# Patient Record
Sex: Female | Born: 1955 | Race: White | Hispanic: No | Marital: Married | Smoking: Former smoker
Health system: Southern US, Community
[De-identification: ages and names within clinical notes are randomized; demographics above are authoritative.]

## PROBLEM LIST (undated history)

## (undated) DIAGNOSIS — L719 Rosacea, unspecified: Secondary | ICD-10-CM

## (undated) HISTORY — PX: APPENDECTOMY: SHX54

## (undated) HISTORY — PX: KNEE ARTHROSCOPY: SUR90

## (undated) HISTORY — PX: TONSILLECTOMY: SUR1361

---

## 1999-06-28 ENCOUNTER — Other Ambulatory Visit: Admission: RE | Admit: 1999-06-28 | Discharge: 1999-06-28 | Payer: Self-pay | Admitting: Obstetrics & Gynecology

## 2000-12-02 ENCOUNTER — Other Ambulatory Visit: Admission: RE | Admit: 2000-12-02 | Discharge: 2000-12-02 | Payer: Self-pay | Admitting: Obstetrics & Gynecology

## 2003-12-12 ENCOUNTER — Other Ambulatory Visit: Admission: RE | Admit: 2003-12-12 | Discharge: 2003-12-12 | Payer: Self-pay | Admitting: Obstetrics & Gynecology

## 2004-05-04 ENCOUNTER — Ambulatory Visit (HOSPITAL_COMMUNITY): Admission: RE | Admit: 2004-05-04 | Discharge: 2004-05-04 | Payer: Self-pay | Admitting: Obstetrics & Gynecology

## 2011-04-23 ENCOUNTER — Other Ambulatory Visit: Payer: Self-pay | Admitting: Dermatology

## 2012-09-15 ENCOUNTER — Other Ambulatory Visit: Payer: Self-pay | Admitting: Obstetrics & Gynecology

## 2013-06-07 ENCOUNTER — Other Ambulatory Visit: Payer: Self-pay | Admitting: Dermatology

## 2013-11-15 ENCOUNTER — Other Ambulatory Visit: Payer: Self-pay | Admitting: Obstetrics & Gynecology

## 2013-11-16 LAB — CYTOLOGY - PAP

## 2014-05-02 ENCOUNTER — Other Ambulatory Visit (INDEPENDENT_AMBULATORY_CARE_PROVIDER_SITE_OTHER): Payer: Self-pay | Admitting: Surgery

## 2014-06-03 ENCOUNTER — Other Ambulatory Visit: Payer: Self-pay | Admitting: Surgery

## 2014-07-07 ENCOUNTER — Encounter (HOSPITAL_BASED_OUTPATIENT_CLINIC_OR_DEPARTMENT_OTHER): Payer: Self-pay | Admitting: *Deleted

## 2014-07-10 NOTE — H&P (Signed)
Annette Neal  [facility] Patient #: [id number] DOB: [birth year] Married / Language: English / Race: White Female  History of Present Illness (Annette Neal A. Magnus IvanBlackman MD;  Patient words: eval left breast.  The patient is a 59 year old female who presents with a complaint of Breast problems. This is a very pleasant female referred by Dr. Aldona BarWein for evaluation of a left breast cyst. This first presented back in 2014 as what appeared to be an infected sebaceous cyst. She reported a small area became erythematous. She went to an urgent care and had an incision and drainage. Most recently, during a physical examination a possible recurrent cyst could be palpated. She had negative mammograms in September 2015. She does have a family history which is positive for breast cancer in her mother at age 59. She denies nipple discharge. She denies breast pain   Other Problems Lamar Laundry(Sonya Bynum, CMA; No pertinent past medical history  Past Surgical History Gilmer Mor(Sonya Bynum, CMA; Appendectomy Colon Polyp Removal - Colonoscopy Oral Surgery Tonsillectomy  Diagnostic Studies History (Sonya Bynum, CMA Colonoscopy 1-5 years ago Mammogram within last year Pap Smear 1-5 years ago  Allergies (Sonya Bynum, C No Known Drug Allergies0  Medication History (Sonya Bynum, CMA; No Current Medications  Social History Lamar Laundry(Sonya Bynum, CMA;  Alcohol use Moderate alcohol use. Caffeine use Coffee. No drug use Tobacco use Former smoker.  Family History Gilmer Mor(Sonya Bynum, CMA;  Arthritis Mother. Breast Cancer Mother. Cancer Sister. Colon Polyps Father. Diabetes Mellitus Father. Heart Disease Father. Hypertension Father. Melanoma Father. Seizure disorder Mother.  Pregnancy / Birth History Gilmer Mor(Sonya Bynum, CMA;  Age at menarche 12 years. Age of menopause 51-55 Contraceptive History Oral contraceptives. Gravida 3 Maternal age 59-25 Para 3  Review of Systems  ( General Not Present- Appetite Loss, Chills, Fatigue, Fever, Night Sweats, Weight Gain and Weight Loss. Skin Not Present- Change in Wart/Mole, Dryness, Hives, Jaundice, New Lesions, Non-Healing Wounds, Rash and Ulcer. HEENT Not Present- Earache, Hearing Loss, Hoarseness, Nose Bleed, Oral Ulcers, Ringing in the Ears, Seasonal Allergies, Sinus Pain, Sore Throat, Visual Disturbances, Wears glasses/contact lenses and Yellow Eyes. Respiratory Not Present- Bloody sputum, Chronic Cough, Difficulty Breathing, Snoring and Wheezing. Breast Present- Breast Mass. Not Present- Breast Pain, Nipple Discharge and Skin Changes. Cardiovascular Not Present- Chest Pain, Difficulty Breathing Lying Down, Leg Cramps, Palpitations, Rapid Heart Rate, Shortness of Breath and Swelling of Extremities. Gastrointestinal Not Present- Abdominal Pain, Bloating, Bloody Stool, Change in Bowel Habits, Chronic diarrhea, Constipation, Difficulty Swallowing, Excessive gas, Gets full quickly at meals, Hemorrhoids, Indigestion, Nausea, Rectal Pain and Vomiting. Female Genitourinary Not Present- Frequency, Nocturia, Painful Urination, Pelvic Pain and Urgency. Musculoskeletal Not Present- Back Pain, Joint Pain, Joint Stiffness, Muscle Pain, Muscle Weakness and Swelling of Extremities. Neurological Not Present- Decreased Memory, Fainting, Headaches, Numbness, Seizures, Tingling, Tremor, Trouble walking and Weakness. Psychiatric Not Present- Anxiety, Bipolar, Change in Sleep Pattern, Depression, Fearful and Frequent crying. Endocrine Not Present- Cold Intolerance, Excessive Hunger, Hair Changes, Heat Intolerance, Hot flashes and New Diabetes. Hematology Not Present- Easy Bruising, Excessive bleeding, Gland problems, HIV and Persistent Infections.   Vitals (  Weight: 169 lb Height: 66in Body Surface Area: 1.89 m Body Mass Index: 27.28 kg/m Temp.: 97.48F(Temporal)  Pulse: 77 (Regular)  BP: 132/80 (Sitting, Left Arm,  Standard)    Physical Exam The physical exam findings are as follows: Note:On exam, there are some skin changes from the previous I&D at the left breast at the 7 o'clock position. On deep palpation, I can feel  approximately a 3-4 mm round mass. This feels deeper than is typical sebaceous cyst and may represent scar tissue. There is no erythema and it is nontender.  Lungs are clear bilaterally  Cardiovascular is regular rate and rhythm    Assessment & Plan  SEBACEOUS CYST OF BREAST, LEFT (610.8  N60.82) Impression: This may just represent scar tissue as I cannot feel a specific cyst. I discussed surgical excision of this area to prevent recurrence first close follow-up as this may just represent scar tissue. She is going to go home and discuss this with her husband. If she elects conservative management, I will see her back in 3 months for reexamination.  Addendum:  She has decided to proceed with surgery.  The risks were discussed in detail and she wishes to proceed with excision of the chronic left breast cyst.

## 2014-07-11 ENCOUNTER — Ambulatory Visit (HOSPITAL_BASED_OUTPATIENT_CLINIC_OR_DEPARTMENT_OTHER): Payer: BC Managed Care – PPO | Admitting: Anesthesiology

## 2014-07-11 ENCOUNTER — Ambulatory Visit (HOSPITAL_BASED_OUTPATIENT_CLINIC_OR_DEPARTMENT_OTHER)
Admission: RE | Admit: 2014-07-11 | Discharge: 2014-07-11 | Disposition: A | Discharge status: Home / Self Care | Payer: BC Managed Care – PPO | Source: Ambulatory Visit | Attending: Surgery | Admitting: Surgery

## 2014-07-11 ENCOUNTER — Encounter (HOSPITAL_BASED_OUTPATIENT_CLINIC_OR_DEPARTMENT_OTHER)
Admission: RE | Disposition: A | Discharge status: Home / Self Care | Payer: Self-pay | Source: Ambulatory Visit | Attending: Surgery

## 2014-07-11 ENCOUNTER — Encounter (HOSPITAL_BASED_OUTPATIENT_CLINIC_OR_DEPARTMENT_OTHER): Payer: Self-pay

## 2014-07-11 DIAGNOSIS — Z803 Family history of malignant neoplasm of breast: Secondary | ICD-10-CM | POA: Diagnosis not present

## 2014-07-11 DIAGNOSIS — N6002 Solitary cyst of left breast: Secondary | ICD-10-CM | POA: Diagnosis present

## 2014-07-11 DIAGNOSIS — Z87891 Personal history of nicotine dependence: Secondary | ICD-10-CM | POA: Insufficient documentation

## 2014-07-11 DIAGNOSIS — N6012 Diffuse cystic mastopathy of left breast: Secondary | ICD-10-CM | POA: Insufficient documentation

## 2014-07-11 HISTORY — DX: Rosacea, unspecified: L71.9

## 2014-07-11 HISTORY — PX: BREAST CYST EXCISION: SHX579

## 2014-07-11 LAB — POCT HEMOGLOBIN-HEMACUE: Hemoglobin: 13.6 g/dL (ref 12.0–15.0)

## 2014-07-11 SURGERY — EXCISION, CYST, BREAST
Anesthesia: General | Site: Breast | Laterality: Left

## 2014-07-11 MED ORDER — BUPIVACAINE-EPINEPHRINE (PF) 0.5% -1:200000 IJ SOLN
INTRAMUSCULAR | Status: AC
  Filled 2014-07-11: qty 30

## 2014-07-11 MED ORDER — ACETAMINOPHEN 325 MG PO TABS: 650.0000 mg | ORAL_TABLET | ORAL | Status: DC | PRN

## 2014-07-11 MED ORDER — HYDROCODONE-ACETAMINOPHEN 5-325 MG PO TABS: 1.0000 | ORAL_TABLET | ORAL | Status: DC | PRN

## 2014-07-11 MED ORDER — BUPIVACAINE-EPINEPHRINE 0.5% -1:200000 IJ SOLN
INTRAMUSCULAR | Status: DC | PRN
  Administered 2014-07-11: 18 mL

## 2014-07-11 MED ORDER — ONDANSETRON HCL 4 MG/2ML IJ SOLN: INTRAMUSCULAR | Status: DC | PRN

## 2014-07-11 MED ORDER — ACETAMINOPHEN 500 MG PO TABS: 1000.0000 mg | ORAL_TABLET | Freq: Four times a day (QID) | ORAL | Status: DC

## 2014-07-11 MED ORDER — PROMETHAZINE HCL 25 MG/ML IJ SOLN: 6.2500 mg | INTRAMUSCULAR | Status: DC | PRN

## 2014-07-11 MED ORDER — ACETAMINOPHEN 650 MG RE SUPP: 650.0000 mg | RECTAL | Status: DC | PRN

## 2014-07-11 MED ORDER — LACTATED RINGERS IV SOLN
INTRAVENOUS | Status: DC
  Administered 2014-07-11: 07:00:00 via INTRAVENOUS

## 2014-07-11 MED ORDER — ONDANSETRON HCL 4 MG/2ML IJ SOLN
INTRAMUSCULAR | Status: DC | PRN
  Administered 2014-07-11: 4 mg via INTRAVENOUS

## 2014-07-11 MED ORDER — SODIUM CHLORIDE 0.9 % IV SOLN: 250.0000 mL | INTRAVENOUS | Status: DC | PRN

## 2014-07-11 MED ORDER — OXYCODONE HCL 5 MG PO TABS: 5.0000 mg | ORAL_TABLET | ORAL | Status: DC | PRN

## 2014-07-11 MED ORDER — FENTANYL CITRATE (PF) 100 MCG/2ML IJ SOLN
INTRAMUSCULAR | Status: DC | PRN
  Administered 2014-07-11: 100 ug via INTRAVENOUS

## 2014-07-11 MED ORDER — GLYCOPYRROLATE 0.2 MG/ML IJ SOLN: 0.2000 mg | Freq: Once | INTRAMUSCULAR | Status: DC | PRN

## 2014-07-11 MED ORDER — MIDAZOLAM HCL 2 MG/2ML IJ SOLN
INTRAMUSCULAR | Status: AC
  Filled 2014-07-11: qty 2

## 2014-07-11 MED ORDER — DEXAMETHASONE SODIUM PHOSPHATE 4 MG/ML IJ SOLN
INTRAMUSCULAR | Status: DC | PRN
  Administered 2014-07-11: 10 mg via INTRAVENOUS

## 2014-07-11 MED ORDER — HYDROMORPHONE HCL 1 MG/ML IJ SOLN: 0.2500 mg | INTRAMUSCULAR | Status: DC | PRN

## 2014-07-11 MED ORDER — MIDAZOLAM HCL 2 MG/2ML IJ SOLN
1.0000 mg | INTRAMUSCULAR | Status: DC | PRN
Start: 2014-07-11 — End: 2014-07-11
  Administered 2014-07-11: 2 mg via INTRAVENOUS

## 2014-07-11 MED ORDER — CEFAZOLIN SODIUM-DEXTROSE 2-3 GM-% IV SOLR
INTRAVENOUS | Status: AC
  Filled 2014-07-11: qty 50

## 2014-07-11 MED ORDER — FENTANYL CITRATE (PF) 100 MCG/2ML IJ SOLN: 50.0000 ug | INTRAMUSCULAR | Status: DC | PRN

## 2014-07-11 MED ORDER — FENTANYL CITRATE (PF) 100 MCG/2ML IJ SOLN
INTRAMUSCULAR | Status: AC
  Filled 2014-07-11: qty 6

## 2014-07-11 MED ORDER — SODIUM CHLORIDE 0.9 % IJ SOLN: 3.0000 mL | INTRAMUSCULAR | Status: DC | PRN

## 2014-07-11 MED ORDER — CEFAZOLIN SODIUM-DEXTROSE 2-3 GM-% IV SOLR
2.0000 g | INTRAVENOUS | Status: AC
  Administered 2014-07-11: 2 g via INTRAVENOUS

## 2014-07-11 MED ORDER — PROPOFOL 10 MG/ML IV BOLUS
INTRAVENOUS | Status: DC | PRN
  Administered 2014-07-11: 200 mg via INTRAVENOUS

## 2014-07-11 MED ORDER — MORPHINE SULFATE 2 MG/ML IJ SOLN: 1.0000 mg | INTRAMUSCULAR | Status: DC | PRN

## 2014-07-11 MED ORDER — SODIUM CHLORIDE 0.9 % IJ SOLN: 3.0000 mL | Freq: Two times a day (BID) | INTRAMUSCULAR | Status: DC

## 2014-07-11 SURGICAL SUPPLY — 46 items
BLADE HEX COATED 2.75 (ELECTRODE) ×3 IMPLANT
BLADE SURG 15 STRL LF DISP TIS (BLADE) ×1 IMPLANT
BLADE SURG 15 STRL SS (BLADE) ×3
CANISTER SUCT 1200ML W/VALVE (MISCELLANEOUS) IMPLANT
CHLORAPREP W/TINT 26ML (MISCELLANEOUS) ×3 IMPLANT
CLIP TI WIDE RED SMALL 6 (CLIP) IMPLANT
CLOSURE WOUND 1/2 X4 (GAUZE/BANDAGES/DRESSINGS)
COVER BACK TABLE 60X90IN (DRAPES) ×3 IMPLANT
COVER MAYO STAND STRL (DRAPES) ×3 IMPLANT
DECANTER SPIKE VIAL GLASS SM (MISCELLANEOUS) IMPLANT
DEVICE DUBIN W/COMP PLATE 8390 (MISCELLANEOUS) IMPLANT
DRAPE LAPAROTOMY 100X72 PEDS (DRAPES) ×3 IMPLANT
DRAPE UTILITY XL STRL (DRAPES) ×3 IMPLANT
DRSG TEGADERM 4X4.75 (GAUZE/BANDAGES/DRESSINGS) ×1 IMPLANT
ELECT REM PT RETURN 9FT ADLT (ELECTROSURGICAL) ×3
ELECTRODE REM PT RTRN 9FT ADLT (ELECTROSURGICAL) ×1 IMPLANT
GLOVE BIOGEL PI IND STRL 7.5 (GLOVE) IMPLANT
GLOVE BIOGEL PI INDICATOR 7.5 (GLOVE) ×2
GLOVE EXAM NITRILE EXT CUFF MD (GLOVE) ×2 IMPLANT
GLOVE SURG SIGNA 7.5 PF LTX (GLOVE) ×3 IMPLANT
GLOVE SURG SS PI 7.5 STRL IVOR (GLOVE) ×2 IMPLANT
GOWN STRL REUS W/ TWL LRG LVL3 (GOWN DISPOSABLE) ×1 IMPLANT
GOWN STRL REUS W/ TWL XL LVL3 (GOWN DISPOSABLE) ×1 IMPLANT
GOWN STRL REUS W/TWL LRG LVL3 (GOWN DISPOSABLE) ×3
GOWN STRL REUS W/TWL XL LVL3 (GOWN DISPOSABLE) ×3
KIT MARKER MARGIN INK (KITS) ×1 IMPLANT
LIQUID BAND (GAUZE/BANDAGES/DRESSINGS) ×3 IMPLANT
NDL HYPO 25X1 1.5 SAFETY (NEEDLE) ×1 IMPLANT
NEEDLE HYPO 25X1 1.5 SAFETY (NEEDLE) ×3 IMPLANT
NS IRRIG 1000ML POUR BTL (IV SOLUTION) ×1 IMPLANT
PACK BASIN DAY SURGERY FS (CUSTOM PROCEDURE TRAY) ×3 IMPLANT
PENCIL BUTTON HOLSTER BLD 10FT (ELECTRODE) ×3 IMPLANT
SLEEVE SCD COMPRESS KNEE MED (MISCELLANEOUS) ×2 IMPLANT
SPONGE GAUZE 4X4 12PLY STER LF (GAUZE/BANDAGES/DRESSINGS) ×1 IMPLANT
SPONGE LAP 4X18 X RAY DECT (DISPOSABLE) ×3 IMPLANT
STRIP CLOSURE SKIN 1/2X4 (GAUZE/BANDAGES/DRESSINGS) ×1 IMPLANT
SUT MNCRL AB 4-0 PS2 18 (SUTURE) ×3 IMPLANT
SUT SILK 2 0 SH (SUTURE) ×1 IMPLANT
SUT VIC AB 3-0 SH 27 (SUTURE) ×3
SUT VIC AB 3-0 SH 27X BRD (SUTURE) ×1 IMPLANT
SYR CONTROL 10ML LL (SYRINGE) ×3 IMPLANT
TOWEL OR 17X24 6PK STRL BLUE (TOWEL DISPOSABLE) ×3 IMPLANT
TOWEL OR NON WOVEN STRL DISP B (DISPOSABLE) ×3 IMPLANT
TUBE CONNECTING 20'X1/4 (TUBING)
TUBE CONNECTING 20X1/4 (TUBING) IMPLANT
YANKAUER SUCT BULB TIP NO VENT (SUCTIONS) IMPLANT

## 2014-07-11 NOTE — Anesthesia Postprocedure Evaluation (Signed)
Anesthesia Post Note  Patient: Annette Neal  Procedure(s) Performed: Procedure(s) (LRB): EXCISION OF CHRONIC LEFT BREAST CYST (Left)  Anesthesia type: general  Patient location: PACU  Post pain: Pain level controlled  Post assessment: Patient's Cardiovascular Status Stable  Last Vitals:  Filed Vitals:   07/11/14 0830  BP: 124/59  Pulse: 56  Temp:   Resp: 10    Post vital signs: Reviewed and stable  Level of consciousness: sedated  Complications: No apparent anesthesia complications

## 2014-07-11 NOTE — Op Note (Signed)
EXCISION OF CHRONIC LEFT BREAST CYST  Procedure Note  Annette Neal 07/11/2014   Pre-op Diagnosis: Left Breast Cyst     Post-op Diagnosis: same  Procedure(s): EXCISION OF CHRONIC LEFT BREAST CYST  Surgeon(s): Abigail Miyamotoouglas Bernice Mullin, MD  Anesthesia: General  Staff:  Circulator: Lenn CalPatricia J Campbell, RN Scrub Person: Donald Poreeborah Neal Blackwell, CST  Estimated Blood Loss: Minimal               Specimens: chronic seb cyst sent to path          Clinica Espan[facility] IncBLACKMAN,Annette Neal   Date: 07/11/2014  Time: 7:55 AM

## 2014-07-11 NOTE — Interval H&P Note (Signed)
History and Physical Interval Note:no change in H and P  07/11/2014 7:01 AM  Annette FormosaJeanie H Neal  has presented today for surgery, with the diagnosis of Left Breast Cyst  The various methods of treatment have been discussed with the patient and family. After consideration of risks, benefits and other options for treatment, the patient has consented to  Procedure(s): EXCISION OF CHRONIC LEFT BREAST CYST (Left) as a surgical intervention .  The patient's history has been reviewed, patient examined, no change in status, stable for surgery.  I have reviewed the patient's chart and labs.  Questions were answered to the patient's satisfaction.     Mars Scheaffer A

## 2014-07-11 NOTE — Op Note (Signed)
[name]:  Helmuth, [name]              ACCOUNT NO.:  [id number]  MEDICAL RECORD NO.:  [id number]  LOCATION:                                FACILITY:  [facility]  PHYSICIAN:  Abigail Miyamotoouglas Marrah Vanevery, M.D. DATE OF BIRTH:  05/07/1955  DATE OF PROCEDURE:  07/11/2014 DATE OF DISCHARGE:  07/11/2014                              OPERATIVE REPORT   PREOPERATIVE DIAGNOSIS:  Chronic left breast cyst.  POSTOPERATIVE DIAGNOSIS:  Chronic left breast cyst.  PROCEDURE:  Excision of chronic left breast cyst.  SURGEON:  Abigail Miyamotoouglas Jahleel Stroschein, M.D.  ANESTHESIA:  General and 0.5% Marcaine.  ESTIMATED BLOOD LOSS:  Minimal.  INDICATIONS:  This is a 59 year old female, who has had an infected sebaceous cyst on her left breast in the past.  She has had incision and drainage of this area.  Because of continued discomfort and occasional infections, decision was made to proceed with excision of this area.  PROCEDURE IN DETAIL:  The patient was brought to the operating room, identified as ArchivistJeannie Shealton.  She was placed supine on the operating room table and general anesthesia was induced.  The left breast was then prepped and draped in usual sterile fashion.  There was skin discoloration at the 7 o'clock position of the breast and a small palpable cyst underneath the skin.  I anesthetized skin with Marcaine and performed elliptical incision including all of this chronically changed skin with a scalpel.  I then completely excised the skin and underlying cyst with the electrocautery.  This was sent to Pathology for evaluation.  I palpated the area, found no other abnormalities.  I then anesthetized the wound further with Marcaine.  I achieved hemostasis with cautery.  I closed the subcutaneous tissue with interrupted 3-0 Vicryl sutures and closed the skin with a running 4-0 Monocryl.  Skin glue was then applied.  The patient tolerated the procedure well.  All the counts were correct at the end of the procedure.  The  patient was then extubated in the operating room and taken in a stable condition to the recovery room.     Abigail Miyamotoouglas Jessee Mezera, M.D.     DB/MEDQ  D:  07/11/2014  T:  07/11/2014  Job:  [id number]

## 2014-07-11 NOTE — Anesthesia Procedure Notes (Signed)
Procedure Name: LMA Insertion Date/Time: 07/11/2014 7:35 AM Performed by: Zenia ResidesPAYNE, Loredana Medellin D Pre-anesthesia Checklist: Patient identified, Emergency Drugs available, Suction available and Patient being monitored Patient Re-evaluated:Patient Re-evaluated prior to inductionOxygen Delivery Method: Circle System Utilized Preoxygenation: Pre-oxygenation with 100% oxygen Intubation Type: IV induction Ventilation: Mask ventilation without difficulty LMA: LMA inserted LMA Size: 4.0 Number of attempts: 1 Airway Equipment and Method: Bite block Placement Confirmation: positive ETCO2 Tube secured with: Tape Dental Injury: Teeth and Oropharynx as per pre-operative assessment

## 2014-07-11 NOTE — Addendum Note (Signed)
Addendum  created 07/11/14 1003 by Ronnette HilaLinda D Conception Doebler, CRNA   Modules edited: Anesthesia Medication Administration

## 2014-07-11 NOTE — Anesthesia Preprocedure Evaluation (Signed)
Anesthesia Evaluation  Patient identified by MRN, date of birth, ID band Patient awake    Reviewed: Allergy & Precautions, NPO status , Patient's Chart, lab work & pertinent test results  Airway Mallampati: II  TM Distance: >3 FB Neck ROM: Full    Dental  (+) Teeth Intact, Dental Advisory Given   Pulmonary former smoker,    Pulmonary exam normal       Cardiovascular negative cardio ROS Normal cardiovascular exam    Neuro/Psych negative neurological ROS  negative psych ROS   GI/Hepatic negative GI ROS, Neg liver ROS,   Endo/Other  negative endocrine ROS  Renal/GU negative Renal ROS  negative genitourinary   Musculoskeletal negative musculoskeletal ROS (+)   Abdominal   Peds negative pediatric ROS (+)  Hematology negative hematology ROS (+)   Anesthesia Other Findings   Reproductive/Obstetrics negative OB ROS                             Anesthesia Physical Anesthesia Plan  ASA: I  Anesthesia Plan: General   Post-op Pain Management:    Induction: Intravenous  Airway Management Planned: LMA  Additional Equipment:   Intra-op Plan:   Post-operative Plan: Extubation in OR  Informed Consent: I have reviewed the patients History and Physical, chart, labs and discussed the procedure including the risks, benefits and alternatives for the proposed anesthesia with the patient or authorized representative who has indicated his/her understanding and acceptance.   Dental advisory given  Plan Discussed with: CRNA, Anesthesiologist and Surgeon  Anesthesia Plan Comments:         Anesthesia Quick Evaluation

## 2014-07-11 NOTE — Transfer of Care (Signed)
Immediate Anesthesia Transfer of Care Note  Patient: Annette FormosaJeanie H Bostrom  Procedure(s) Performed: Procedure(s): EXCISION OF CHRONIC LEFT BREAST CYST (Left)  Patient Location: PACU  Anesthesia Type:General  Level of Consciousness: sedated  Airway & Oxygen Therapy: Patient Spontanous Breathing and Patient connected to face mask oxygen  Post-op Assessment: Report given to RN and Post -op Vital signs reviewed and stable  Post vital signs: Reviewed and stable  Last Vitals:  Filed Vitals:   07/11/14 0647  BP: 127/62  Pulse: 61  Temp: 36.4 C  Resp: 16    Complications: No apparent anesthesia complications

## 2014-07-11 NOTE — Discharge Instructions (Signed)
[facility] °Office Phone Number [phone] ° °BREAST BIOPSY/ PARTIAL MASTECTOMY: POST OP INSTRUCTIONS ° °Always review your discharge instruction sheet given to you by the facility where your surgery was performed. ° °IF YOU HAVE DISABILITY OR FAMILY LEAVE FORMS, YOU MUST BRING THEM TO THE OFFICE FOR PROCESSING.  DO NOT GIVE THEM TO YOUR DOCTOR. ° °1. A prescription for pain medication may be given to you upon discharge.  Take your pain medication as prescribed, if needed.  If narcotic pain medicine is not needed, then you may take acetaminophen (Tylenol) or ibuprofen (Advil) as needed. °2. Take your usually prescribed medications unless otherwise directed °3. If you need a refill on your pain medication, please contact your pharmacy.  They will contact our office to request authorization.  Prescriptions will not be filled after 5pm or on week-ends. °4. You should eat very light the first 24 hours after surgery, such as soup, crackers, pudding, etc.  Resume your normal diet the day after surgery. °5. Most patients will experience some swelling and bruising in the breast.  Ice packs and a good support bra will help.  Swelling and bruising can take several days to resolve.  °6. It is common to experience some constipation if taking pain medication after surgery.  Increasing fluid intake and taking a stool softener will usually help or prevent this problem from occurring.  A mild laxative (Milk of Magnesia or Miralax) should be taken according to package directions if there are no bowel movements after 48 hours. °7. Unless discharge instructions indicate otherwise, you may remove your bandages 24-48 hours after surgery, and you may shower at that time.  You may have steri-strips (small skin tapes) in place directly over the incision.  These strips should be left on the skin for 7-10 days.  If your surgeon used skin glue on the incision, you may shower in 24 hours.  The glue will flake off over the  next 2-3 weeks.  Any sutures or staples will be removed at the office during your follow-up visit. °8. ACTIVITIES:  You may resume regular daily activities (gradually increasing) beginning the next day.  Wearing a good support bra or sports bra minimizes pain and swelling.  You may have sexual intercourse when it is comfortable. °a. You may drive when you no longer are taking prescription pain medication, you can comfortably wear a seatbelt, and you can safely maneuver your car and apply brakes. °b. RETURN TO WORK:  ______________________________________________________________________________________ °9. You should see your doctor in the office for a follow-up appointment approximately two weeks after your surgery.  Your doctor’s nurse will typically make your follow-up appointment when she calls you with your pathology report.  Expect your pathology report 2-3 business days after your surgery.  You may call to check if you do not hear from us after three days. °10. OTHER INSTRUCTIONS: _______________________________________________________________________________________________ _____________________________________________________________________________________________________________________________________ °_____________________________________________________________________________________________________________________________________ °_____________________________________________________________________________________________________________________________________ ° °WHEN TO CALL YOUR DOCTOR: °1. Fever over 101.0 °2. Nausea and/or vomiting. °3. Extreme swelling or bruising. °4. Continued bleeding from incision. °5. Increased pain, redness, or drainage from the incision. ° °The clinic staff is available to answer your questions during regular business hours.  Please don’t hesitate to call and ask to speak to one of the nurses for clinical concerns.  If you have a medical emergency, go to the nearest  emergency room or call 911.  A surgeon from [facility] is always on call at the hospital. ° °For further questions, please visit [email or web address]  ° ° ° °  Post Anesthesia Home Care Instructions ° °Activity: °Get plenty of rest for the remainder of the day. A responsible adult should stay with you for 24 hours following the procedure.  °For the next 24 hours, DO NOT: °-Drive a car °-Operate machinery °-Drink alcoholic beverages °-Take any medication unless instructed by your physician °-Make any legal decisions or sign important papers. ° °Meals: °Start with liquid foods such as gelatin or soup. Progress to regular foods as tolerated. Avoid greasy, spicy, heavy foods. If nausea and/or vomiting occur, drink only clear liquids until the nausea and/or vomiting subsides. Call your physician if vomiting continues. ° °Special Instructions/Symptoms: °Your throat may feel dry or sore from the anesthesia or the breathing tube placed in your throat during surgery. If this causes discomfort, gargle with warm salt water. The discomfort should disappear within 24 hours. ° °If you had a scopolamine patch placed behind your ear for the management of post- operative nausea and/or vomiting: ° °1. The medication in the patch is effective for 72 hours, after which it should be removed.  Wrap patch in a tissue and discard in the trash. Wash hands thoroughly with soap and water. °2. You may remove the patch earlier than 72 hours if you experience unpleasant side effects which may include dry mouth, dizziness or visual disturbances. °3. Avoid touching the patch. Wash your hands with soap and water after contact with the patch. °  ° °

## 2014-07-12 ENCOUNTER — Encounter (HOSPITAL_BASED_OUTPATIENT_CLINIC_OR_DEPARTMENT_OTHER): Payer: Self-pay | Admitting: Surgery

## 2014-12-05 ENCOUNTER — Other Ambulatory Visit: Payer: Self-pay | Admitting: Obstetrics & Gynecology

## 2014-12-05 DIAGNOSIS — R928 Other abnormal and inconclusive findings on diagnostic imaging of breast: Secondary | ICD-10-CM

## 2014-12-08 ENCOUNTER — Ambulatory Visit
Admission: RE | Admit: 2014-12-08 | Discharge: 2014-12-08 | Disposition: A | Discharge status: Home / Self Care | Payer: BC Managed Care – PPO | Source: Ambulatory Visit | Attending: Obstetrics & Gynecology | Admitting: Obstetrics & Gynecology

## 2014-12-08 DIAGNOSIS — R928 Other abnormal and inconclusive findings on diagnostic imaging of breast: Secondary | ICD-10-CM

## 2016-11-22 ENCOUNTER — Other Ambulatory Visit: Payer: Self-pay | Admitting: Physician Assistant

## 2016-11-22 DIAGNOSIS — K5730 Diverticulosis of large intestine without perforation or abscess without bleeding: Secondary | ICD-10-CM

## 2016-11-22 DIAGNOSIS — R1032 Left lower quadrant pain: Secondary | ICD-10-CM

## 2016-11-26 ENCOUNTER — Ambulatory Visit
Admission: RE | Admit: 2016-11-26 | Discharge: 2016-11-26 | Disposition: A | Discharge status: Home / Self Care | Payer: BC Managed Care – PPO | Source: Ambulatory Visit | Attending: Physician Assistant | Admitting: Physician Assistant

## 2016-11-26 DIAGNOSIS — K5730 Diverticulosis of large intestine without perforation or abscess without bleeding: Secondary | ICD-10-CM

## 2016-11-26 DIAGNOSIS — R1032 Left lower quadrant pain: Secondary | ICD-10-CM

## 2016-11-26 MED ORDER — IOPAMIDOL (ISOVUE-300) INJECTION 61%
100.0000 mL | Freq: Once | INTRAVENOUS | Status: AC | PRN
  Administered 2016-11-26: 100 mL via INTRAVENOUS

## 2019-10-11 ENCOUNTER — Encounter (HOSPITAL_COMMUNITY): Payer: Self-pay | Admitting: Specialist

## 2019-10-11 ENCOUNTER — Other Ambulatory Visit: Payer: Self-pay

## 2019-10-11 NOTE — Progress Notes (Addendum)
COVID Vaccine Completed: Date COVID Vaccine completed: 3023021 COVID vaccine manufacturer: Pfizer    Moderna   [employer]   PCP -Gerlene Burdock [facility] Cardiologist - None  Chest x-ray -  EKG -  Stress Test -  ECHO -  Cardiac Cath -   Sleep Study -  CPAP -   Fasting Blood Sugar -  Checks Blood Sugar _____ times a day  Blood Thinner Instructions: Aspirin Instructions: Last Dose:  Anesthesia review:   Completes ADL's w.SOB Patient denies shortness of breath, fever, cough and chest pain at PAT appointment   Patient verbalized understanding of instructions that were given to them at the PAT appointment. Patient was also instructed that they will need to review over the PAT instructions again at home before surgery.

## 2019-10-12 ENCOUNTER — Other Ambulatory Visit: Payer: Self-pay

## 2019-10-12 ENCOUNTER — Encounter (HOSPITAL_COMMUNITY)
Admission: RE | Disposition: A | Discharge status: Home / Self Care | Payer: Self-pay | Source: Ambulatory Visit | Attending: Specialist

## 2019-10-12 ENCOUNTER — Ambulatory Visit (HOSPITAL_COMMUNITY): Payer: BC Managed Care – PPO | Admitting: Anesthesiology

## 2019-10-12 ENCOUNTER — Encounter (HOSPITAL_COMMUNITY): Payer: Self-pay | Admitting: Specialist

## 2019-10-12 ENCOUNTER — Observation Stay (HOSPITAL_COMMUNITY)
Admission: RE | Admit: 2019-10-12 | Discharge: 2019-10-14 | Disposition: A | Discharge status: Home / Self Care | Payer: BC Managed Care – PPO | Source: Ambulatory Visit | Attending: Specialist | Admitting: Specialist

## 2019-10-12 DIAGNOSIS — M00861 Arthritis due to other bacteria, right knee: Principal | ICD-10-CM | POA: Insufficient documentation

## 2019-10-12 DIAGNOSIS — Z87891 Personal history of nicotine dependence: Secondary | ICD-10-CM | POA: Insufficient documentation

## 2019-10-12 DIAGNOSIS — B9689 Other specified bacterial agents as the cause of diseases classified elsewhere: Secondary | ICD-10-CM | POA: Insufficient documentation

## 2019-10-12 DIAGNOSIS — M009 Pyogenic arthritis, unspecified: Secondary | ICD-10-CM | POA: Diagnosis present

## 2019-10-12 DIAGNOSIS — Z20822 Contact with and (suspected) exposure to covid-19: Secondary | ICD-10-CM | POA: Insufficient documentation

## 2019-10-12 HISTORY — PX: KNEE ARTHROSCOPY: SHX127

## 2019-10-12 LAB — CBC
HCT: 45.6 % (ref 36.0–46.0)
Hemoglobin: 14.7 g/dL (ref 12.0–15.0)
MCH: 31.4 pg (ref 26.0–34.0)
MCHC: 32.2 g/dL (ref 30.0–36.0)
MCV: 97.4 fL (ref 80.0–100.0)
Platelets: 286 10*3/uL (ref 150–400)
RBC: 4.68 MIL/uL (ref 3.87–5.11)
RDW: 12.8 % (ref 11.5–15.5)
WBC: 12.1 10*3/uL — ABNORMAL HIGH (ref 4.0–10.5)
nRBC: 0 % (ref 0.0–0.2)

## 2019-10-12 LAB — SYNOVIAL CELL COUNT + DIFF, W/ CRYSTALS
Crystals, Fluid: NONE SEEN
Eosinophils-Synovial: 0 % (ref 0–1)
Lymphocytes-Synovial Fld: 1 % (ref 0–20)
Monocyte-Macrophage-Synovial Fluid: 12 % — ABNORMAL LOW (ref 50–90)
Neutrophil, Synovial: 87 % — ABNORMAL HIGH (ref 0–25)
WBC, Synovial: 68470 /mm3 — ABNORMAL HIGH (ref 0–200)

## 2019-10-12 LAB — SARS CORONAVIRUS 2 BY RT PCR (HOSPITAL ORDER, PERFORMED IN ~~LOC~~ HOSPITAL LAB): SARS Coronavirus 2: NEGATIVE

## 2019-10-12 LAB — CREATININE, SERUM
Creatinine, Ser: 0.74 mg/dL (ref 0.44–1.00)
GFR calc Af Amer: >60 mL/min (ref >60)
GFR calc non Af Amer: >60 mL/min (ref >60)

## 2019-10-12 SURGERY — ARTHROSCOPY, KNEE
Anesthesia: Regional | Site: Knee | Laterality: Left

## 2019-10-12 MED ORDER — METHOCARBAMOL 500 MG PO TABS
500.0000 mg | ORAL_TABLET | Freq: Four times a day (QID) | ORAL | Status: DC | PRN
  Administered 2019-10-12 – 2019-10-14 (×3): 500 mg via ORAL
  Filled 2019-10-12 (×3): qty 1

## 2019-10-12 MED ORDER — MIDAZOLAM HCL 2 MG/2ML IJ SOLN
INTRAMUSCULAR | Status: AC
  Filled 2019-10-12: qty 2

## 2019-10-12 MED ORDER — ACETAMINOPHEN 500 MG PO TABS
1000.0000 mg | ORAL_TABLET | Freq: Four times a day (QID) | ORAL | Status: AC
  Administered 2019-10-12 – 2019-10-13 (×4): 1000 mg via ORAL
  Filled 2019-10-12 (×4): qty 2

## 2019-10-12 MED ORDER — HYDROMORPHONE HCL 1 MG/ML IJ SOLN
INTRAMUSCULAR | Status: AC
  Filled 2019-10-12: qty 1

## 2019-10-12 MED ORDER — ACETAMINOPHEN 10 MG/ML IV SOLN
INTRAVENOUS | Status: AC
  Filled 2019-10-12: qty 100

## 2019-10-12 MED ORDER — HYDROMORPHONE HCL 1 MG/ML IJ SOLN
0.5000 mg | INTRAMUSCULAR | Status: DC | PRN
  Filled 2019-10-12: qty 1

## 2019-10-12 MED ORDER — GABAPENTIN 300 MG PO CAPS
300.0000 mg | ORAL_CAPSULE | Freq: Three times a day (TID) | ORAL | Status: DC
  Administered 2019-10-12 – 2019-10-14 (×5): 300 mg via ORAL
  Filled 2019-10-12 (×5): qty 1

## 2019-10-12 MED ORDER — AZELAIC ACID 15 % EX FOAM: 1.0000 "application " | Freq: Every day | CUTANEOUS | Status: DC

## 2019-10-12 MED ORDER — PROPOFOL 10 MG/ML IV BOLUS
INTRAVENOUS | Status: DC | PRN
  Administered 2019-10-12: 50 mg via INTRAVENOUS
  Administered 2019-10-12: 150 mg via INTRAVENOUS

## 2019-10-12 MED ORDER — PROPOFOL 10 MG/ML IV BOLUS
INTRAVENOUS | Status: AC
  Filled 2019-10-12: qty 20

## 2019-10-12 MED ORDER — DEXAMETHASONE SODIUM PHOSPHATE 10 MG/ML IJ SOLN
INTRAMUSCULAR | Status: AC
  Filled 2019-10-12: qty 1

## 2019-10-12 MED ORDER — SODIUM CHLORIDE 0.9 % IR SOLN
Status: DC | PRN
  Administered 2019-10-12: 12000 mL

## 2019-10-12 MED ORDER — METHOCARBAMOL 500 MG IVPB - SIMPLE MED
500.0000 mg | Freq: Four times a day (QID) | INTRAVENOUS | Status: DC | PRN
  Filled 2019-10-12: qty 50

## 2019-10-12 MED ORDER — DEXAMETHASONE SODIUM PHOSPHATE 10 MG/ML IJ SOLN
INTRAMUSCULAR | Status: DC | PRN
  Administered 2019-10-12 (×2): 10 mg

## 2019-10-12 MED ORDER — OXYCODONE HCL 5 MG/5ML PO SOLN
5.0000 mg | Freq: Once | ORAL | Status: DC | PRN
Start: 2019-10-12 — End: 2019-10-12

## 2019-10-12 MED ORDER — FENTANYL CITRATE (PF) 100 MCG/2ML IJ SOLN
INTRAMUSCULAR | Status: AC
  Filled 2019-10-12: qty 2

## 2019-10-12 MED ORDER — SENNOSIDES-DOCUSATE SODIUM 8.6-50 MG PO TABS: 1.0000 | ORAL_TABLET | Freq: Every evening | ORAL | Status: DC | PRN

## 2019-10-12 MED ORDER — FENTANYL CITRATE (PF) 100 MCG/2ML IJ SOLN
INTRAMUSCULAR | Status: DC | PRN
  Administered 2019-10-12 (×4): 25 ug via INTRAVENOUS

## 2019-10-12 MED ORDER — VANCOMYCIN HCL 750 MG/150ML IV SOLN
750.0000 mg | Freq: Two times a day (BID) | INTRAVENOUS | Status: DC
  Administered 2019-10-13: 750 mg via INTRAVENOUS
  Filled 2019-10-12 (×2): qty 150

## 2019-10-12 MED ORDER — ENOXAPARIN SODIUM 40 MG/0.4ML ~~LOC~~ SOLN
40.0000 mg | SUBCUTANEOUS | Status: DC
  Administered 2019-10-13 – 2019-10-14 (×2): 40 mg via SUBCUTANEOUS
  Filled 2019-10-12 (×2): qty 0.4

## 2019-10-12 MED ORDER — ONDANSETRON HCL 4 MG PO TABS: 4.0000 mg | ORAL_TABLET | Freq: Four times a day (QID) | ORAL | Status: DC | PRN

## 2019-10-12 MED ORDER — ACETAMINOPHEN 325 MG PO TABS
325.0000 mg | ORAL_TABLET | Freq: Four times a day (QID) | ORAL | Status: DC | PRN
  Administered 2019-10-14: 650 mg via ORAL
  Filled 2019-10-12: qty 2

## 2019-10-12 MED ORDER — ONDANSETRON HCL 4 MG/2ML IJ SOLN: 4.0000 mg | Freq: Four times a day (QID) | INTRAMUSCULAR | Status: DC | PRN

## 2019-10-12 MED ORDER — CHLORHEXIDINE GLUCONATE 0.12 % MT SOLN
15.0000 mL | Freq: Once | OROMUCOSAL | Status: AC
  Administered 2019-10-12: 15 mL via OROMUCOSAL

## 2019-10-12 MED ORDER — OXYCODONE HCL 5 MG PO TABS
5.0000 mg | ORAL_TABLET | Freq: Once | ORAL | Status: DC | PRN
Start: 2019-10-12 — End: 2019-10-12

## 2019-10-12 MED ORDER — ACETAMINOPHEN 10 MG/ML IV SOLN
INTRAVENOUS | Status: DC | PRN
Start: 2019-10-12 — End: 2019-10-12
  Administered 2019-10-12: 1000 mg via INTRAVENOUS

## 2019-10-12 MED ORDER — LIDOCAINE HCL (CARDIAC) PF 100 MG/5ML IV SOSY
PREFILLED_SYRINGE | INTRAVENOUS | Status: DC | PRN
  Administered 2019-10-12: 20 mg via INTRAVENOUS

## 2019-10-12 MED ORDER — MAGNESIUM CITRATE PO SOLN: 1.0000 | Freq: Once | ORAL | Status: DC | PRN

## 2019-10-12 MED ORDER — LIDOCAINE 2% (20 MG/ML) 5 ML SYRINGE
INTRAMUSCULAR | Status: AC
  Filled 2019-10-12: qty 5

## 2019-10-12 MED ORDER — ONDANSETRON HCL 4 MG/2ML IJ SOLN
INTRAMUSCULAR | Status: DC | PRN
  Administered 2019-10-12: 4 mg via INTRAVENOUS

## 2019-10-12 MED ORDER — ONDANSETRON HCL 4 MG/2ML IJ SOLN
INTRAMUSCULAR | Status: AC
  Filled 2019-10-12: qty 2

## 2019-10-12 MED ORDER — PROMETHAZINE HCL 25 MG/ML IJ SOLN: 6.2500 mg | INTRAMUSCULAR | Status: DC | PRN

## 2019-10-12 MED ORDER — ROPIVACAINE HCL 5 MG/ML IJ SOLN
INTRAMUSCULAR | Status: DC | PRN
  Administered 2019-10-12: 30 mL via PERINEURAL

## 2019-10-12 MED ORDER — BISACODYL 5 MG PO TBEC: 5.0000 mg | DELAYED_RELEASE_TABLET | Freq: Every day | ORAL | Status: DC | PRN

## 2019-10-12 MED ORDER — HYDROMORPHONE HCL 1 MG/ML IJ SOLN
0.2500 mg | INTRAMUSCULAR | Status: DC | PRN
  Administered 2019-10-12 (×2): 0.5 mg via INTRAVENOUS

## 2019-10-12 MED ORDER — OXYCODONE HCL 5 MG PO TABS: 5.0000 mg | ORAL_TABLET | ORAL | Status: DC | PRN

## 2019-10-12 MED ORDER — DEXAMETHASONE SODIUM PHOSPHATE 4 MG/ML IJ SOLN
INTRAMUSCULAR | Status: DC | PRN
  Administered 2019-10-12: 8 mg via INTRAVENOUS

## 2019-10-12 MED ORDER — ACETAMINOPHEN 10 MG/ML IV SOLN: 1000.0000 mg | Freq: Once | INTRAVENOUS | Status: DC | PRN

## 2019-10-12 MED ORDER — MIDAZOLAM HCL 5 MG/5ML IJ SOLN
INTRAMUSCULAR | Status: DC | PRN
  Administered 2019-10-12: 1 mg via INTRAVENOUS

## 2019-10-12 MED ORDER — VANCOMYCIN HCL 1750 MG/350ML IV SOLN
1750.0000 mg | Freq: Once | INTRAVENOUS | Status: AC
  Administered 2019-10-12: 1750 mg via INTRAVENOUS
  Filled 2019-10-12: qty 350

## 2019-10-12 MED ORDER — SODIUM CHLORIDE 0.9 % IV SOLN: INTRAVENOUS | Status: DC

## 2019-10-12 MED ORDER — FENTANYL CITRATE (PF) 100 MCG/2ML IJ SOLN
50.0000 ug | Freq: Once | INTRAMUSCULAR | Status: AC
  Administered 2019-10-12: 50 ug via INTRAVENOUS
  Filled 2019-10-12: qty 2

## 2019-10-12 MED ORDER — LACTATED RINGERS IV SOLN: INTRAVENOUS | Status: DC

## 2019-10-12 MED ORDER — TRAMADOL HCL 50 MG PO TABS
50.0000 mg | ORAL_TABLET | Freq: Four times a day (QID) | ORAL | Status: DC
  Administered 2019-10-13 – 2019-10-14 (×6): 50 mg via ORAL
  Filled 2019-10-12 (×7): qty 1

## 2019-10-12 MED ORDER — DIPHENHYDRAMINE HCL 12.5 MG/5ML PO ELIX: 12.5000 mg | ORAL_SOLUTION | ORAL | Status: DC | PRN

## 2019-10-12 MED ORDER — OXYCODONE HCL 5 MG PO TABS: 10.0000 mg | ORAL_TABLET | ORAL | Status: DC | PRN

## 2019-10-12 MED ORDER — ORAL CARE MOUTH RINSE: 15.0000 mL | Freq: Once | OROMUCOSAL | Status: AC

## 2019-10-12 MED ORDER — PHENYLEPHRINE HCL (PRESSORS) 10 MG/ML IV SOLN
INTRAVENOUS | Status: DC | PRN
  Administered 2019-10-12: 80 ug via INTRAVENOUS

## 2019-10-12 MED ORDER — 0.9 % SODIUM CHLORIDE (POUR BTL) OPTIME
TOPICAL | Status: DC | PRN
  Administered 2019-10-12: 1000 mL

## 2019-10-12 MED ORDER — PHENYLEPHRINE 40 MCG/ML (10ML) SYRINGE FOR IV PUSH (FOR BLOOD PRESSURE SUPPORT)
PREFILLED_SYRINGE | INTRAVENOUS | Status: AC
  Filled 2019-10-12: qty 10

## 2019-10-12 MED ORDER — CEFAZOLIN SODIUM-DEXTROSE 2-4 GM/100ML-% IV SOLN
2.0000 g | INTRAVENOUS | Status: AC
  Administered 2019-10-12: 2 g via INTRAVENOUS
  Filled 2019-10-12: qty 100

## 2019-10-12 MED ORDER — MIDAZOLAM HCL 2 MG/2ML IJ SOLN
1.0000 mg | INTRAMUSCULAR | Status: DC
  Administered 2019-10-12: 2 mg via INTRAVENOUS
  Filled 2019-10-12: qty 2

## 2019-10-12 SURGICAL SUPPLY — 49 items
ABLATOR ASPIRATE 50D MULTI-PRT (SURGICAL WAND) ×3 IMPLANT
BANDAGE ESMARK 6X9 LF (GAUZE/BANDAGES/DRESSINGS) ×1 IMPLANT
BLADE EXCALIBUR 4.0MM X 13CM (MISCELLANEOUS) ×1
BLADE EXCALIBUR 4.0X13 (MISCELLANEOUS) ×2 IMPLANT
BNDG CMPR 9X6 STRL LF SNTH (GAUZE/BANDAGES/DRESSINGS) ×1
BNDG ELASTIC 6X5.8 VLCR STR LF (GAUZE/BANDAGES/DRESSINGS) ×2 IMPLANT
BNDG ESMARK 6X9 LF (GAUZE/BANDAGES/DRESSINGS) ×3
BNDG GAUZE ELAST 4 BULKY (GAUZE/BANDAGES/DRESSINGS) ×3 IMPLANT
CANISTER SUCT 3000ML PPV (MISCELLANEOUS) ×3 IMPLANT
CNTNR URN SCR LID CUP LEK RST (MISCELLANEOUS) IMPLANT
CONT SPEC 4OZ STRL OR WHT (MISCELLANEOUS)
COVER WAND RF STERILE (DRAPES) ×3 IMPLANT
CUFF TOURN SGL QUICK 34 (TOURNIQUET CUFF) ×6
CUFF TRNQT CYL 34X4.125X (TOURNIQUET CUFF) ×2 IMPLANT
DRAPE ARTHROSCOPY W/POUCH 114 (DRAPES) ×3 IMPLANT
DRAPE INCISE IOBAN 66X45 STRL (DRAPES) ×3 IMPLANT
DRAPE U-SHAPE 47X51 STRL (DRAPES) ×3 IMPLANT
DRSG PAD ABDOMINAL 8X10 ST (GAUZE/BANDAGES/DRESSINGS) ×4 IMPLANT
DURAPREP 26ML APPLICATOR (WOUND CARE) ×3 IMPLANT
EVACUATOR 1/8 PVC DRAIN (DRAIN) ×2 IMPLANT
EXCALIBUR 3.8MM X 13CM (MISCELLANEOUS) ×3 IMPLANT
GAUZE SPONGE 4X4 12PLY STRL (GAUZE/BANDAGES/DRESSINGS) ×3 IMPLANT
GAUZE XEROFORM 1X8 LF (GAUZE/BANDAGES/DRESSINGS) ×3 IMPLANT
GLOVE BIO SURGEON STRL SZ7 (GLOVE) ×3 IMPLANT
GLOVE BIO SURGEON STRL SZ8 (GLOVE) ×3 IMPLANT
GLOVE BIOGEL PI IND STRL 7.5 (GLOVE) ×1 IMPLANT
GLOVE BIOGEL PI INDICATOR 7.5 (GLOVE) ×2
GLOVE INDICATOR 8.0 STRL GRN (GLOVE) ×3 IMPLANT
GOWN STRL REUS W/TWL XL LVL3 (GOWN DISPOSABLE) ×6 IMPLANT
IMMOBILIZER KNEE 20 (SOFTGOODS) ×3
IMMOBILIZER KNEE 20 THIGH 36 (SOFTGOODS) IMPLANT
IV NS IRRIG 3000ML ARTHROMATIC (IV SOLUTION) ×6 IMPLANT
KIT BASIN OR (CUSTOM PROCEDURE TRAY) ×3 IMPLANT
KIT TURNOVER KIT A (KITS) ×3 IMPLANT
MANIFOLD NEPTUNE II (INSTRUMENTS) ×3 IMPLANT
NEEDLE HYPO 22GX1.5 SAFETY (NEEDLE) ×3 IMPLANT
PACK ARTHROSCOPY DSU (CUSTOM PROCEDURE TRAY) ×3 IMPLANT
PAD ARMBOARD 7.5X6 YLW CONV (MISCELLANEOUS) IMPLANT
SUT ETHILON 4 0 PS 2 18 (SUTURE) ×3 IMPLANT
SWAB COLLECTION DEVICE MRSA (MISCELLANEOUS) ×2 IMPLANT
SWAB CULTURE ESWAB REG 1ML (MISCELLANEOUS) ×2 IMPLANT
SYR CONTROL 10ML LL (SYRINGE) ×3 IMPLANT
TOWEL OR 17X26 10 PK STRL BLUE (TOWEL DISPOSABLE) ×3 IMPLANT
TUBING ARTHROSCOPY IRRIG 16FT (MISCELLANEOUS) ×3 IMPLANT
TUBING CONNECTING 10 (TUBING) ×2 IMPLANT
TUBING CONNECTING 10' (TUBING) ×1
WAND APOLLORF SJ50 AR-9845 (SURGICAL WAND) ×2 IMPLANT
WATER STERILE IRR 500ML POUR (IV SOLUTION) ×3 IMPLANT
WRAP KNEE MAXI GEL POST OP (GAUZE/BANDAGES/DRESSINGS) ×2 IMPLANT

## 2019-10-12 NOTE — H&P (Signed)
Annette Neal is an 64 y.o. female.   Chief Complaint: left knee pain HPI: This is a pleasant 64 year old female who is having left knee pain 7 weeks out from an uncomplicated left knee arthroscopy, partial medial and partial lateral menisectomy and chondroplasty. Patient was doing well postoperatively until about 6 weeks out from surgery. She presented to the clinic and was afebrile but complaining of left knee pain and swelling. Her knee was aspirated in the office and the fluid was sent out. There was no immediate growth. Over the weekend there was some cultures that eventually grew out staphylococcus sapropphyticus. She was seen again yesterday 8/16 in the office. Again the patient was afebrile but significant knee effusion and pain. She is being set up today for a left knee I and D due to a left knee infection.   Past Medical History:  Diagnosis Date  . Rosacea     Past Surgical History:  Procedure Laterality Date  . APPENDECTOMY      64 years old  . BREAST CYST EXCISION Left 07/11/2014   Procedure: EXCISION OF CHRONIC LEFT BREAST CYST;  Surgeon: Abigail Miyamoto, MD;  Location: [facility];  Service: General;  Laterality: Left;  . KNEE ARTHROSCOPY Left   . TONSILLECTOMY     as child    History reviewed. No pertinent family history. Social History:  reports that she has quit smoking. She has never used smokeless tobacco. She reports current alcohol use. She reports that she does not use drugs.  Allergies:  Allergies  Allergen Reactions  . Codeine Nausea And Vomiting  . Demerol [Meperidine] Nausea And Vomiting    Medications Prior to Admission  Medication Sig Dispense Refill  . Azelaic Acid (FINACEA) 15 % FOAM Apply 1 application topically daily.     Marland Kitchen HYDROcodone-acetaminophen (NORCO) 5-325 MG per tablet Take 1-2 tablets by mouth every 4 (four) hours as needed. (Patient not taking: Reported on 10/11/2019) 30 tablet 0  . ibuprofen (ADVIL,MOTRIN) 200 MG tablet Take  200 mg by mouth as needed. (Patient not taking: Reported on 10/11/2019)    . predniSONE (DELTASONE) 10 MG tablet Take 10 mg by mouth daily with breakfast. (Patient not taking: Reported on 10/11/2019)      Results for orders placed or performed during the hospital encounter of 10/12/19 (from the past 48 hour(s))  SARS Coronavirus 2 by RT PCR (hospital order, performed in [facility] hospital lab) Nasopharyngeal Nasopharyngeal Swab     Status: None   Collection Time: 10/12/19  1:09 PM   Specimen: Nasopharyngeal Swab  Result Value Ref Range   SARS Coronavirus 2 NEGATIVE NEGATIVE    Comment: (NOTE) SARS-CoV-2 target nucleic acids are NOT DETECTED.  The SARS-CoV-2 RNA is generally detectable in upper and lower respiratory specimens during the acute phase of infection. The lowest concentration of SARS-CoV-2 viral copies this assay can detect is 250 copies / mL. A negative result does not preclude SARS-CoV-2 infection and should not be used as the sole basis for treatment or other patient management decisions.  A negative result may occur with improper specimen collection / handling, submission of specimen other than nasopharyngeal swab, presence of viral mutation(s) within the areas targeted by this assay, and inadequate number of viral copies (<250 copies / mL). A negative result must be combined with clinical observations, patient history, and epidemiological information.  Fact Sheet for Patients:   BoilerBrush.com.cy  Fact Sheet for Healthcare Providers: https://pope.com/  This test is not yet  approved or  cleared by the [place] and has been authorized for detection and/or diagnosis of SARS-CoV-2 by FDA under an Emergency Use Authorization (EUA).  This EUA will remain in effect (meaning this test can be used) for the duration of the COVID-19 declaration under Section 564(b)(1) of the Act, 21 U.S.C. section 360bbb-3(b)(1), unless  the authorization is terminated or revoked sooner.  Performed at [facility], [address], [place], [place] [address]   CBC per protocol     Status: Abnormal   Collection Time: 10/12/19  1:37 PM  Result Value Ref Range   WBC 12.1 (H) 4.0 - 10.5 K/uL   RBC 4.68 3.87 - 5.11 MIL/uL   Hemoglobin 14.7 12.0 - 15.0 g/dL   HCT 09.3 36 - 46 %   MCV 97.4 80.0 - 100.0 fL   MCH 31.4 26.0 - 34.0 pg   MCHC 32.2 30.0 - 36.0 g/dL   RDW 23.5 57.3 - 22.0 %   Platelets 286 150 - 400 K/uL   nRBC 0.0 0.0 - 0.2 %    Comment: Performed at [facility], [address] [place], [place] [address]   No results found.  Review of Systems  Constitutional: Negative.   HENT: Negative.   Eyes: Negative.   Respiratory: Negative.   Cardiovascular: Negative.   Gastrointestinal: Negative.   Endocrine: Negative.   Musculoskeletal: Positive for arthralgias and joint swelling.  Skin: Negative.   Allergic/Immunologic: Negative.   Neurological: Negative.   Hematological: Negative.   Psychiatric/Behavioral: Negative.   All other systems reviewed and are negative.   Blood pressure (!) 149/91, pulse (!) 104, temperature 98.1 F (36.7 C), temperature source Oral, resp. rate 16, height 5\' 6"  (1.676 m), weight 76.2 kg, last menstrual period 03/09/2011, SpO2 99 %. Physical Exam Vitals reviewed.  Constitutional:      General: She is not in acute distress.    Appearance: Normal appearance. She is normal weight.  HENT:     Head: Normocephalic and atraumatic.     Nose: Nose normal.  Eyes:     Extraocular Movements: Extraocular movements intact.  Musculoskeletal:     Cervical back: Normal range of motion.     Comments: Noticeable effusion of the left knee Pain with ROM of left knee Warm to touch Calf supple  NVI in left lower extremity  Skin:    General: Skin is warm and dry.     Capillary Refill: Capillary refill takes less than 2 seconds.  Neurological:      General: No focal deficit present.     Mental Status: She is alert and oriented to person, place, and time.  Psychiatric:        Mood and Affect: Mood normal.        Behavior: Behavior normal.        Thought Content: Thought content normal.        Judgment: Judgment normal.      Assessment/Plan Left knee infections s/p arthroscopy -Patient had risks and benefits of surgery discussed yesterday in the office. She has elected to proceed with surgery at this time. She will be having a left knee I and D.  -She will be staying overnight for possible IV abx and possible PICC line placement. Will consult ID after surgical procedure.   05/07/2011, Cherie Dark  [facility] [phone] 10/12/2019, 3:29 PM

## 2019-10-12 NOTE — Interval H&P Note (Signed)
History and Physical Interval Note:  10/12/2019 3:54 PM  Annette Neal  has presented today for surgery, with the diagnosis of left knee septic arthritis synovitis.  The various methods of treatment have been discussed with the patient and family. After consideration of risks, benefits and other options for treatment, the patient has consented to  Procedure(s) with comments: ARTHROSCOPY KNEE debridement (Left) - femoral nerve block as a surgical intervention.  The patient's history has been reviewed, patient examined, no change in status, stable for surgery.  I have reviewed the patient's chart and labs.  Questions were answered to the patient's satisfaction.     Breck Maryland ANDREW   

## 2019-10-12 NOTE — Interval H&P Note (Signed)
History and Physical Interval Note:  10/12/2019 3:52 PM  Annette Neal  has presented today for surgery, with the diagnosis of left knee septic arthritis synovitis.  The various methods of treatment have been discussed with the patient and family. After consideration of risks, benefits and other options for treatment, the patient has consented to  Procedure(s) with comments: ARTHROSCOPY KNEE debridement (Left) - femoral nerve block as a surgical intervention.  The patient's history has been reviewed, patient examined, no change in status, stable for surgery.  I have reviewed the patient's chart and labs.  Questions were answered to the patient's satisfaction.     Girtie Wiersma ANDREW

## 2019-10-12 NOTE — Anesthesia Procedure Notes (Signed)
Anesthesia Regional Block: Adductor canal block   Pre-Anesthetic Checklist: ,, timeout performed, Correct Patient, Correct Site, Correct Laterality, Correct Procedure, Correct Position, site marked, Risks and benefits discussed,  Surgical consent,  Pre-op evaluation,  At surgeon's request and post-op pain management  Laterality: Left  Prep: Maximum Sterile Barrier Precautions used, chloraprep       Needles:  Injection technique: Single-shot  Needle Type: Echogenic Stimulator Needle     Needle Length: 9cm  Needle Gauge: 22     Additional Needles:   Procedures:,,,, ultrasound used (permanent image in chart),,,,  Narrative:  Start time: 10/12/2019 3:30 PM End time: 10/12/2019 3:38 PM Injection made incrementally with aspirations every 5 mL.  Performed by: Personally  Anesthesiologist: Lannie Fields, DO  Additional Notes: Monitors applied. No increased pain on injection. No increased resistance to injection. Injection made in 5cc increments. Good needle visualization. Patient tolerated procedure well.

## 2019-10-12 NOTE — Progress Notes (Signed)
Assisted Dr. Beth Finucane with left, ultrasound guided, adductor canal block. Side rails up, monitors on throughout procedure. See vital signs in flow sheet. Tolerated Procedure well. ° °

## 2019-10-12 NOTE — Transfer of Care (Signed)
Immediate Anesthesia Transfer of Care Note  Patient: Annette Neal  Procedure(s) Performed: ARTHROSCOPY KNEE debridement (Left Knee)  Patient Location: PACU  Anesthesia Type:General and Regional  Level of Consciousness: awake, alert  and oriented  Airway & Oxygen Therapy: Patient Spontanous Breathing and Patient connected to face mask oxygen  Post-op Assessment: Report given to RN and Post -op Vital signs reviewed and stable  Post vital signs: Reviewed and stable  Last Vitals:  Vitals Value Taken Time  BP 154/90 10/12/19 1720  Temp    Pulse 81 10/12/19 1721  Resp 14 10/12/19 1721  SpO2 100 % 10/12/19 1721  Vitals shown include unvalidated device data.  Last Pain:  Vitals:   10/12/19 1337  TempSrc: Oral  PainSc:       Patients Stated Pain Goal: 4 (10/12/19 1328)  Complications: No complications documented.

## 2019-10-12 NOTE — Progress Notes (Signed)
Pharmacy Antibiotic Note  Annette Neal is a 64 y.o. female admitted on 10/12/2019 with left septic knee arthritis.  Pharmacy has been consulted for vancomycin dosing.  Pt underwent left knee arthroscopy, pt has developed pain/swelling post-op. Knee was aspirated in the office and grew staphylococcus saphrophyticus. Pt underwent I&D on 10/12/19.  Today, 10/12/19 -WBC 12.1 -Afebrile -SCr 0.74, CrCl ~75 mL/min  Plan:  Vancomycin 1750 mg LD followed by 750 mg IV q12h  Vancomycin goal AUC 400-550 and/or VT 10-20 mcg/mL  Follow renal function and culture data  Height: 5\' 6"  (167.6 cm) Weight: 76.2 kg (168 lb) IBW/kg (Calculated) : 59.3  Temp (24hrs), Avg:98.3 F (36.8 C), Min:98.1 F (36.7 C), Max:98.5 F (36.9 C)  Recent Labs  Lab 10/12/19 1337  WBC 12.1*    CrCl cannot be calculated (No successful lab value found.).    Allergies  Allergen Reactions  . Codeine Nausea And Vomiting  . Demerol [Meperidine] Nausea And Vomiting    Antimicrobi/als this admission: vancomycin 8/17 >>  cefazoline pre-op 8/17   Dose adjustments this admission:  Microbiology results: 8/17 BCx:  8/17 wound:   Thank you for allowing pharmacy to be a part of this patient's care.  9/17, PharmD 10/12/2019 6:05 PM

## 2019-10-12 NOTE — Anesthesia Procedure Notes (Signed)
Procedure Name: LMA Insertion Date/Time: 10/12/2019 4:16 PM Performed by: Ludwig Lean, CRNA Pre-anesthesia Checklist: Patient identified, Emergency Drugs available, Suction available and Patient being monitored Patient Re-evaluated:Patient Re-evaluated prior to induction Oxygen Delivery Method: Circle system utilized Preoxygenation: Pre-oxygenation with 100% oxygen Induction Type: IV induction LMA: LMA inserted LMA Size: 4.0 Number of attempts: 1 Placement Confirmation: positive ETCO2 and breath sounds checked- equal and bilateral Tube secured with: Tape Dental Injury: Teeth and Oropharynx as per pre-operative assessment

## 2019-10-12 NOTE — Interval H&P Note (Signed)
History and Physical Interval Note:  10/12/2019 3:54 PM  Annette Neal  has presented today for surgery, with the diagnosis of left knee septic arthritis synovitis.  The various methods of treatment have been discussed with the patient and family. After consideration of risks, benefits and other options for treatment, the patient has consented to  Procedure(s) with comments: ARTHROSCOPY KNEE debridement (Left) - femoral nerve block as a surgical intervention.  The patient's history has been reviewed, patient examined, no change in status, stable for surgery.  I have reviewed the patient's chart and labs.  Questions were answered to the patient's satisfaction.     [name] ANDREW

## 2019-10-12 NOTE — Op Note (Signed)
Preop diagnosis left knee septic arthritis Postoperative diagnosis same Procedure left knee arthroscopic irrigation and debridement with synovectomy intraoperative joint fluid analysis taken Surgeon Valma Cava, MD Assistant Waneta Martins, PA-C Anesthesia abductor canal block with general Tourniquet time none Drains 1 Hemovac Complications none Disposition PACU stable Operative findings left knee with copious amounts of purulent material sanguinous and purulent Gram stain culture aerobic anaerobic cell count differential crystal analysis sent arthroscopic evaluation revealed quite a bit of fibrinous exudate as from before hand quite a bit of osteoarthritic change.  Operative details Patient was encountered in the holding area correct side identified marked signed appropriately IV antibiotics have been given.  Taken the operating room placed in supine position under general anesthesia left lower extremity elevated prepped with DuraPrep and draped in a sterile fashion.  Arthroscopic portals were established to the previous arthroscopic sites inferomedial inferolateral diagnostic arthroscopy of the above-noted copious amount of purulent material and fibrinous exudate she underwent a thorough debridement of all exudate and copious irrigation with over 12 L of normal saline.  E fluent was clear drain was placed through the inferomedial portal site portals closed drain sutured sterile dressing was applied knee immobilizer.  She tolerated the procedure well there were no complications she is awakened and taken from the operating room to PACU in stable condition.  Infectious disease consult will be called today for guidance on antibiotic route and length.  Office culture had grown out Staphylococcus saprophyticus.  Sensitive to all antibiotics tested except TMP/SMX.

## 2019-10-12 NOTE — Anesthesia Preprocedure Evaluation (Addendum)
Anesthesia Evaluation  Patient identified by MRN, date of birth, ID band Patient awake    Reviewed: Allergy & Precautions, NPO status , Patient's Chart, lab work & pertinent test results  Airway Mallampati: II  TM Distance: >3 FB Neck ROM: Full    Dental no notable dental hx. (+) Teeth Intact, Dental Advisory Given   Pulmonary former smoker,    Pulmonary exam normal breath sounds clear to auscultation       Cardiovascular negative cardio ROS Normal cardiovascular exam Rhythm:Regular Rate:Normal     Neuro/Psych negative neurological ROS  negative psych ROS   GI/Hepatic negative GI ROS, Neg liver ROS,   Endo/Other  negative endocrine ROS  Renal/GU negative Renal ROS  negative genitourinary   Musculoskeletal L knee septic arthritis, synovitis    Abdominal   Peds  Hematology negative hematology ROS (+) hct 45.6, plt 286   Anesthesia Other Findings   Reproductive/Obstetrics negative OB ROS                            Anesthesia Physical Anesthesia Plan  ASA: II  Anesthesia Plan: General and Regional   Post-op Pain Management: GA combined w/ Regional for post-op pain   Induction: Intravenous  PONV Risk Score and Plan: 3 and Ondansetron, Dexamethasone and Treatment may vary due to age or medical condition  Airway Management Planned: LMA  Additional Equipment: None  Intra-op Plan:   Post-operative Plan: Extubation in OR  Informed Consent: I have reviewed the patients History and Physical, chart, labs and discussed the procedure including the risks, benefits and alternatives for the proposed anesthesia with the patient or authorized representative who has indicated his/her understanding and acceptance.     Dental advisory given  Plan Discussed with: CRNA  Anesthesia Plan Comments:         Anesthesia Quick Evaluation

## 2019-10-12 NOTE — H&P (View-Only) (Signed)
Assisted Dr. Beth Finucane with left, ultrasound guided, adductor canal block. Side rails up, monitors on throughout procedure. See vital signs in flow sheet. Tolerated Procedure well. ° °

## 2019-10-13 ENCOUNTER — Observation Stay: Payer: Self-pay

## 2019-10-13 DIAGNOSIS — M00062 Staphylococcal arthritis, left knee: Secondary | ICD-10-CM

## 2019-10-13 DIAGNOSIS — B957 Other staphylococcus as the cause of diseases classified elsewhere: Secondary | ICD-10-CM | POA: Diagnosis not present

## 2019-10-13 DIAGNOSIS — M00861 Arthritis due to other bacteria, right knee: Secondary | ICD-10-CM | POA: Diagnosis not present

## 2019-10-13 LAB — CREATININE, SERUM
Creatinine, Ser: 0.7 mg/dL (ref 0.44–1.00)
GFR calc Af Amer: >60 mL/min (ref >60)
GFR calc non Af Amer: >60 mL/min (ref >60)

## 2019-10-13 LAB — HIV ANTIBODY (ROUTINE TESTING W REFLEX): HIV Screen 4th Generation wRfx: NONREACTIVE

## 2019-10-13 MED ORDER — CEFAZOLIN SODIUM-DEXTROSE 2-4 GM/100ML-% IV SOLN
2.0000 g | Freq: Three times a day (TID) | INTRAVENOUS | Status: DC
  Administered 2019-10-13 – 2019-10-14 (×4): 2 g via INTRAVENOUS
  Filled 2019-10-13 (×4): qty 100

## 2019-10-13 NOTE — Progress Notes (Signed)
PHARMACY CONSULT NOTE FOR:  OUTPATIENT  PARENTERAL ANTIBIOTIC THERAPY (OPAT)  Indication: Septic joint infection  Regimen: cefazolin 2g IV q8h End date: 11/08/2019  IV antibiotic discharge orders are pended. To discharging provider:  please sign these orders via discharge navigator,  Select New Orders & click on the button choice - Manage This Unsigned Work.     Thank you for allowing pharmacy to be a part of this patient's care.  Jeannetta Nap 10/13/2019, 2:34 PM

## 2019-10-13 NOTE — Consult Note (Signed)
[facility] for Infectious Disease       Reason for Consult: post op infection    Referring Physician: Dr. Theda Sers  Active Problems:   Infection of left knee ([place])   . acetaminophen  1,000 mg Oral Q6H  . enoxaparin (LOVENOX) injection  40 mg Subcutaneous Q24H  . gabapentin  300 mg Oral TID  . traMADol  50 mg Oral Q6H    Recommendations: cefazolin picc line Will need likely 4 weeks IV therapy  Diagnosis: Septic arthritis s/p arthroscopic surgery  Culture Result: Staph saprophyticus   Allergies  Allergen Reactions  . Codeine Nausea And Vomiting  . Demerol [Meperidine] Nausea And Vomiting    OPAT Orders Discharge antibiotics to be given via PICC line Discharge antibiotics: cefazolin Per pharmacy protocol yes Duration: 4 weeks through 9/13  [facility] Care Per Protocol: yes  Home health RN for IV administration and teaching; PICC line care and labs.    Labs weekly while on IV antibiotics: _x_ CBC with differential __ BMP _x_ CMP __ CRP __ ESR __ Vancomycin trough __ CK  _x_ Please pull PIC at completion of IV antibiotics __ Please leave PIC in place until doctor has seen patient or been notified  Fax weekly labs to [phone]  Clinic Follow Up Appt: 9/13 9 am  Assessment: Left knee septic arthritis following left knee arthroscopy done about 7 weeks ago.  Cultuer with Staph saprophyticus per Dr. Theda Sers and sensitive to all antibiotics on panel except for TMP/SMX.  Cefazolin will give good coverage and will need for 4 weeks.    Antibiotics: Vancomycin and cefazolin  HPI: Annette Neal is a 64 y.o. female s/p arthroscopic surgery 7 weeks ago and now with knee pain and aspiration with elevated WBCs then taken to the OR yesterday and noted copious purulence and fibrinous exudate.  68,470 WBCs, culture ngtd from the OR.  Culture growth with Staph sapprophyticus that is methicillin sensitive per the OR note from Dr. Theda Sers (only TMP resistant).      Review of Systems:  Constitutional: negative for fevers and chills Gastrointestinal: negative for nausea and diarrhea All other systems reviewed and are negative    Past Medical History:  Diagnosis Date  . Rosacea     Social History   Tobacco Use  . Smoking status: Former Research scientist (life sciences)  . Smokeless tobacco: Never Used  . Tobacco comment: Quit  30 years ago  Vaping Use  . Vaping Use: Never used  Substance Use Topics  . Alcohol use: Yes    Comment: Occassional glasse of wine 1-2 a month  . Drug use: No    History reviewed. No pertinent family history.  Allergies  Allergen Reactions  . Codeine Nausea And Vomiting  . Demerol [Meperidine] Nausea And Vomiting    Physical Exam: Constitutional: in no apparent distress  Vitals:   10/13/19 0826 10/13/19 1353  BP: 121/88 104/69  Pulse: 68 97  Resp: 16 16  Temp: 97.9 F (36.6 C) 98.8 F (37.1 C)  SpO2: 100% 98%   EYES: anicteric Musculoskeletal: no pedal edema noted, knee wrapped Skin: negatives: no rash Neuro:non-focal   Lab Results  Component Value Date   WBC 12.1 (H) 10/12/2019   HGB 14.7 10/12/2019   HCT 45.6 10/12/2019   MCV 97.4 10/12/2019   PLT 286 10/12/2019    Lab Results  Component Value Date   CREATININE 0.70 10/13/2019   No results found for: ALT, AST, GGT, ALKPHOS   Microbiology: Recent Results (  from the past 240 hour(s))  SARS Coronavirus 2 by RT PCR (hospital order, performed in [facility] hospital lab) Nasopharyngeal Nasopharyngeal Swab     Status: None   Collection Time: 10/12/19  1:09 PM   Specimen: Nasopharyngeal Swab  Result Value Ref Range Status   SARS Coronavirus 2 NEGATIVE NEGATIVE Final    Comment: (NOTE) SARS-CoV-2 target nucleic acids are NOT DETECTED.  The SARS-CoV-2 RNA is generally detectable in upper and lower respiratory specimens during the acute phase of infection. The lowest concentration of SARS-CoV-2 viral copies this assay can detect is 250 copies / mL. A negative  result does not preclude SARS-CoV-2 infection and should not be used as the sole basis for treatment or other patient management decisions.  A negative result may occur with improper specimen collection / handling, submission of specimen other than nasopharyngeal swab, presence of viral mutation(s) within the areas targeted by this assay, and inadequate number of viral copies (<250 copies / mL). A negative result must be combined with clinical observations, patient history, and epidemiological information.  Fact Sheet for Patients:   StrictlyIdeas.no  Fact Sheet for Healthcare Providers: BankingDealers.co.za  This test is not yet approved or  cleared by the [place] FDA and has been authorized for detection and/or diagnosis of SARS-CoV-2 by FDA under an Emergency Use Authorization (EUA).  This EUA will remain in effect (meaning this test can be used) for the duration of the COVID-19 declaration under Section 564(b)(1) of the Act, 21 U.S.C. section 360bbb-3(b)(1), unless the authorization is terminated or revoked sooner.  Performed at [facility], [place] [address] [place], [place] [address]   Body fluid culture     Status: None (Preliminary result)   Collection Time: 10/12/19  4:28 PM   Specimen: PATH Cytology Misc. fluid; Body Fluid  Result Value Ref Range Status   Specimen Description   Final    JOINT FLUID LEFT KNEE Performed at [facility] [address], [place], [place] [address]    Special Requests   Final    NONE Performed at [facility], [place] [address], [place], [place] [address]    Gram Stain   Final    MODERATE WBC PRESENT, PREDOMINANTLY PMN NO ORGANISMS SEEN Performed at [facility], [place] [address] [place], [place] [address]    Culture PENDING  Incomplete   Report Status PENDING  Incomplete  Aerobic/Anaerobic Culture (surgical/deep wound)      Status: None (Preliminary result)   Collection Time: 10/12/19  4:28 PM   Specimen: PATH Cytology Misc. fluid; Body Fluid  Result Value Ref Range Status   Specimen Description   Final    JOINT FLUID LEFT KNEE Performed at [facility] [address] [place], [place] [address]    Special Requests   Final    NONE Performed at [facility], [place] [address], [place], [place] [address]    Gram Stain   Final    MODERATE WBC PRESENT, PREDOMINANTLY PMN NO ORGANISMS SEEN Performed at [facility], [place] [address], [place], [place] [address]    Culture PENDING  Incomplete   Report Status PENDING  Incomplete    Thayer Headings, [facility] [email or web address] 10/13/2019, 2:08 PM

## 2019-10-13 NOTE — Plan of Care (Signed)
Plan of care reviewed and discussed with the patient. 

## 2019-10-13 NOTE — Evaluation (Signed)
Physical Therapy Evaluation Patient Details Name: Annette Neal MRN: [id number] DOB: 12/24/1955 Today's Date: 10/13/2019   History of Present Illness  Pt is a 64 year old female s/p left knee arthroscopic irrigation and debridement with synovectomy on 10/12/19.  Pt had uncomplicated left knee arthroscopy, partial medial and partial lateral menisectomy and chondroplasty about 7 weeks ago.  Clinical Impression  Pt admitted with above diagnosis.  Pt currently with functional limitations due to the deficits listed below (see PT Problem List). Pt will benefit from skilled PT to increase their independence and safety with mobility to allow discharge to the venue listed below.  Pt assisted with ambulating with RW and maintaining NWB with KI in place.  Pt reports poor stability with previous use of crutches and agreeable for RW upon d/c for safety.  Pt will need to practice steps prior to d/c home.  Pt reports spouse can assist as needed.     Follow Up Recommendations No PT follow up    Equipment Recommendations  Rolling walker with 5" wheels;3in1 (PT)    Recommendations for Other Services       Precautions / Restrictions Precautions Precautions: Knee Required Braces or Orthoses: Knee Immobilizer - Left Restrictions Weight Bearing Restrictions: Yes LLE Weight Bearing: Non weight bearing      Mobility  Bed Mobility Overal bed mobility: Modified Independent                Transfers Overall transfer level: Needs assistance Equipment used: Rolling walker (2 wheeled) Transfers: Sit to/from Stand Sit to Stand: Min guard         General transfer comment: min/guard for safety, cues for UE and LE positioning  Ambulation/Gait Ambulation/Gait assistance: Min guard Gait Distance (Feet): 80 Feet Assistive device: Rolling walker (2 wheeled) Gait Pattern/deviations: Step-to pattern     General Gait Details: verbal cues for RW positioning and maintaining NWB, distance to  tolerance, reports UE fatigue  Stairs            Wheelchair Mobility    Modified Rankin (Stroke Patients Only)       Balance                                             Pertinent Vitals/Pain Pain Assessment: No/denies pain    Home Living Family/patient expects to be discharged to:: Private residence Living Arrangements: Spouse/significant other   Type of Home: House Home Access: Stairs to enter   Secretary/administrator of Steps: 4 Home Layout: One level Home Equipment: Crutches      Prior Function Level of Independence: Independent with assistive device(s)         Comments: crutches after L knee arthroscopy     Hand Dominance        Extremity/Trunk Assessment        Lower Extremity Assessment Lower Extremity Assessment: LLE deficits/detail LLE Deficits / Details: able to perform ankle pumps, maintained KI       Communication   Communication: No difficulties  Cognition Arousal/Alertness: Awake/alert Behavior During Therapy: WFL for tasks assessed/performed Overall Cognitive Status: Within Functional Limits for tasks assessed                                        General Comments  Exercises     Assessment/Plan    PT Assessment Patient needs continued PT services  PT Problem List Decreased mobility;Decreased knowledge of precautions;Decreased knowledge of use of DME;Decreased activity tolerance;Pain;Decreased strength       PT Treatment Interventions Gait training;DME instruction;Therapeutic exercise;Balance training;Therapeutic activities;Functional mobility training;Stair training;Patient/family education    PT Goals (Current goals can be found in the Care Plan section)  Acute Rehab PT Goals PT Goal Formulation: With patient Time For Goal Achievement: 10/20/19 Potential to Achieve Goals: Good    Frequency Min 5X/week   Barriers to discharge        Co-evaluation                AM-PAC PT "6 Clicks" Mobility  Outcome Measure Help needed turning from your back to your side while in a flat bed without using bedrails?: A Little Help needed moving from lying on your back to sitting on the side of a flat bed without using bedrails?: A Little Help needed moving to and from a bed to a chair (including a wheelchair)?: A Little Help needed standing up from a chair using your arms (e.g., wheelchair or bedside chair)?: A Little Help needed to walk in hospital room?: A Little Help needed climbing 3-5 steps with a railing? : A Little 6 Click Score: 18    End of Session Equipment Utilized During Treatment: Gait belt Activity Tolerance: Patient tolerated treatment well Patient left: in chair;with call bell/phone within reach Nurse Communication: Mobility status PT Visit Diagnosis: Other abnormalities of gait and mobility (R26.89)    Time: 9798-9211 PT Time Calculation (min) (ACUTE ONLY): 15 min   Charges:   PT Evaluation $PT Eval Low Complexity: 1 Low        Kati PT, DPT Acute Rehabilitation Services Pager: [phone] Office: [phone]   Maida Sale E 10/13/2019, 10:33 AM

## 2019-10-13 NOTE — Plan of Care (Signed)
°  Problem: Education: °Goal: Knowledge of General Education information will improve °Description: Including pain rating scale, medication(s)/side effects and non-pharmacologic comfort measures °Outcome: Progressing °  °Problem: Health Behavior/Discharge Planning: °Goal: Ability to manage health-related needs will improve °Outcome: Progressing °  °Problem: Clinical Measurements: °Goal: Ability to maintain clinical measurements within normal limits will improve °Outcome: Progressing °Goal: Will remain free from infection °Outcome: Progressing °Goal: Diagnostic test results will improve °Outcome: Progressing °Goal: Respiratory complications will improve °Outcome: Progressing °Goal: Cardiovascular complication will be avoided °Outcome: Progressing °  °Problem: Activity: °Goal: Risk for activity intolerance will decrease °Outcome: Progressing °  °Problem: Coping: °Goal: Level of anxiety will decrease °Outcome: Progressing °  °Problem: Elimination: °Goal: Will not experience complications related to bowel motility °Outcome: Progressing °  °Problem: Pain Managment: °Goal: General experience of comfort will improve °Outcome: Progressing °  °Problem: Safety: °Goal: Ability to remain free from injury will improve °Outcome: Progressing °  °Problem: Skin Integrity: °Goal: Risk for impaired skin integrity will decrease °Outcome: Progressing °  °

## 2019-10-13 NOTE — Progress Notes (Signed)
Subjective: 1 Day Post-Op Procedure(s) (LRB): ARTHROSCOPY KNEE debridement (Left) and irrigation Patient reports pain as 5 on 0-10 scale.  Which is significantly improved from 10 out of 10 yesterday. Patient has gotten up with physical therapy, has been able to ambulate the halls, nonweightbearing with knee immobilizer on She is able to safely use the walker as well as use the restroom. Overall patient is doing well, no complaints or concerns at this time. Infectious disease has not been in to see her yet today.  Objective: Vital signs in last 24 hours: Temp:  [97.9 F (36.6 C)-98.8 F (37.1 C)] 97.9 F (36.6 C) (08/18 0826) Pulse Rate:  [68-104] 68 (08/18 0826) Resp:  [11-23] 16 (08/18 0826) BP: (111-154)/(72-98) 121/88 (08/18 0826) SpO2:  [93 %-100 %] 100 % (08/18 0826) Weight:  [76.2 kg] 76.2 kg (08/17 1345)  Intake/Output from previous day: 08/17 0701 - 08/18 0700 In: 1701.7 [P.O.:240; I.V.:1111.7; IV Piggyback:350] Out: 1830 [Urine:1800; Drains:30] Intake/Output this shift: Total I/O In: 589.9 [P.O.:240; I.V.:199.9; IV Piggyback:150] Out: -   Recent Labs    10/12/19 1337  HGB 14.7   Recent Labs    10/12/19 1337  WBC 12.1*  RBC 4.68  HCT 45.6  PLT 286   Recent Labs    10/12/19 1855 10/13/19 0259  CREATININE 0.74 0.70   No results for input(s): LABPT, INR in the last 72 hours.  Neurologically intact Neurovascular intact Sensation intact distally Intact pulses distally Dorsiflexion/Plantar flexion intact Incision: dressing C/D/I Compartment soft   Assessment/Plan: 1 Day Post-Op Procedure(s) (LRB): ARTHROSCOPY KNEE debridement (Left) Advance diet Up with therapy, nonweightbearing on left lower extremity, continue with knee immobilizer Waiting for infectious disease consult.  Continue vancomycin until infectious disease makes a recommendation. Waiting for culture results to come back We will continue to monitor.   Denman George [facility] [phone] 10/13/2019, 12:39 PM

## 2019-10-13 NOTE — TOC Initial Note (Signed)
Transition of Care [facility]) - Initial/Assessment Note    Patient Details  Name: Annette Neal MRN: [id number] Date of Birth: 01-24-1956  [facility]) CM/SW Contact:    Lennart Pall, LCSW Phone Number: 10/13/2019, 3:30 PM  Clinical Narrative:                 Met with pt and spouse this afternoon to review dc needs.  Pt reports she understands plan is for home with IV abx.  Aware that this SW will make referrals for [facility] (and possibly PT if recommended).  No preference on [facility] agency used.  I have alerted Carolynn Sayers, RN with [facility] who will arrange to meet with pt and spouse at some point tomorrow for education.  Will continue to follow.  Expected Discharge Plan: [place] Barriers to Discharge: Continued Medical Work up   Patient Goals and CMS Choice Patient states their goals for this hospitalization and ongoing recovery are:: to return home CMS Medicare.gov Compare Post Acute Care list provided to:: Patient Choice offered to / list presented to : Patient  Expected Discharge Plan and Services Expected Discharge Plan: [place] In-house Referral: Clinical Social Work   Post Acute Care Choice: Durable Medical Equipment, Home Health Living arrangements for the past 2 months: [place]                                      Prior Living Arrangements/Services Living arrangements for the past 2 months: Single Family Home Lives with:: Spouse Patient language and need for interpreter reviewed:: Yes Do you feel safe going back to the place where you live?: Yes      Need for Family Participation in Patient Care: Yes (Comment) Care giver support system in place?: Yes (comment)   Criminal Activity/Legal Involvement Pertinent to Current Situation/Hospitalization: No - Comment as needed  Activities of Daily Living Home Assistive Devices/Equipment: Crutches ADL Screening (condition at time of  admission) Patient's cognitive ability adequate to safely complete daily activities?: Yes Is the patient deaf or have difficulty hearing?: No Does the patient have difficulty seeing, even when wearing glasses/contacts?: No Does the patient have difficulty concentrating, remembering, or making decisions?: No Patient able to express need for assistance with ADLs?: Yes Does the patient have difficulty dressing or bathing?: No Independently performs ADLs?: Yes (appropriate for developmental age) Does the patient have difficulty walking or climbing stairs?: Yes Weakness of Legs: None Weakness of Arms/Hands: None  Permission Sought/Granted Permission sought to share information with : Family Supports    Share Information with NAME: Annette Neal     Permission granted to share info w Relationship: spouse  Permission granted to share info w Contact Information: [phone]  Emotional Assessment Appearance:: Appears stated age Attitude/Demeanor/Rapport: Gracious, Engaged Affect (typically observed): Accepting, Pleasant Orientation: : Oriented to Self, Oriented to Place, Oriented to  Time, Oriented to Situation Alcohol / Substance Use: Not Applicable Psych Involvement: No (comment)  Admission diagnosis:  Infection of left knee [facility]) [M00.9] Patient Active Problem List   Diagnosis Date Noted  . Infection of left knee ([place]) 10/12/2019   PCP:  System, Pcp Not In Pharmacy:   [id number] [place], [place] - [id number] [id number]: [phone]: [phone]    Social Determinants of Health (SDOH) Interventions  Readmission Risk Interventions No flowsheet data found.

## 2019-10-13 NOTE — Progress Notes (Signed)
Nutrition Brief Note RD working remotely.  Patient identified on the Malnutrition Screening Tool (MST) Report  Wt Readings from Last 15 Encounters:  10/12/19 76.2 kg  07/11/14 77.8 kg    Body mass index is 27.12 kg/m. Patient meets criteria for overweight status based on current BMI. No other weight recordings in the chart other than the two listed above. Skin WDL.   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    Current diet order is Regular and she ate 100% of breakfast and 100% of lunch today (total of 1406 kcal, 42 grams protein).   Labs and medications reviewed.   No nutrition interventions warranted at this time. If nutrition issues arise, please consult RD.       Trenton Gammon, MS, RD, LDN, CNSC Inpatient Clinical Dietitian RD pager # available in [facility]  After hours/weekend pager # available in [facility]

## 2019-10-13 NOTE — Progress Notes (Signed)
Patient updated about Picc line team- tentative to place tomorrow per Vascular team. Call bell within reach. General questions answered/ clarified about the picc line.

## 2019-10-14 ENCOUNTER — Encounter (HOSPITAL_COMMUNITY): Payer: Self-pay | Admitting: Specialist

## 2019-10-14 DIAGNOSIS — M00861 Arthritis due to other bacteria, right knee: Secondary | ICD-10-CM | POA: Diagnosis not present

## 2019-10-14 LAB — CREATININE, SERUM
Creatinine, Ser: 0.73 mg/dL (ref 0.44–1.00)
GFR calc Af Amer: >60 mL/min (ref >60)
GFR calc non Af Amer: >60 mL/min (ref >60)

## 2019-10-14 MED ORDER — ONDANSETRON HCL 4 MG PO TABS: 4.0000 mg | ORAL_TABLET | Freq: Every day | ORAL | 1 refills | Status: AC | PRN

## 2019-10-14 MED ORDER — CEFAZOLIN IV (FOR PTA / DISCHARGE USE ONLY): 2.0000 g | Freq: Three times a day (TID) | INTRAVENOUS | 0 refills | Status: DC

## 2019-10-14 MED ORDER — CHLORHEXIDINE GLUCONATE CLOTH 2 % EX PADS: 6.0000 | MEDICATED_PAD | Freq: Every day | CUTANEOUS | Status: DC

## 2019-10-14 MED ORDER — SODIUM CHLORIDE 0.9% FLUSH: 10.0000 mL | Freq: Two times a day (BID) | INTRAVENOUS | Status: DC

## 2019-10-14 MED ORDER — OXYCODONE HCL 5 MG PO TABS
5.0000 mg | ORAL_TABLET | Freq: Three times a day (TID) | ORAL | 0 refills | Status: AC | PRN
Start: 2019-10-14 — End: 2020-10-13

## 2019-10-14 MED ORDER — SODIUM CHLORIDE 0.9% FLUSH: 10.0000 mL | INTRAVENOUS | Status: DC | PRN

## 2019-10-14 MED ORDER — HEPARIN SOD (PORK) LOCK FLUSH 100 UNIT/ML IV SOLN
250.0000 [IU] | INTRAVENOUS | Status: AC | PRN
  Administered 2019-10-14: 250 [IU]
  Filled 2019-10-14: qty 2.5

## 2019-10-14 NOTE — Progress Notes (Signed)
Physical Therapy Treatment Patient Details Name: Annette Neal MRN: [id number] DOB: [birth year] Today's Date: 10/14/2019    History of Present Illness Pt is a 64 year old female s/p left knee arthroscopic irrigation and debridement with synovectomy on 10/12/19.  Pt had uncomplicated left knee arthroscopy, partial medial and partial lateral menisectomy and chondroplasty about 7 weeks ago.    PT Comments    Pt ambulated in hallway and practiced safe stair technique.  Pt reports up to bathroom earlier without KI so reviewed no left knee ROM and maintaining KI especially for standing or OOB activity.  Pt reports understanding.  Pt also encouraged to perform chair push ups using armrests for strengthening UEs as she reports weakness and fatigue with RW use and NWB Lt LE status.  Pt awaiting PICC line and then likely d/c home however she would like to practice steps again tomorrow if she remains in hospital.  Stair handout provided in case pt does d/c home today.   Follow Up Recommendations  No PT follow up     Equipment Recommendations  Rolling walker with 5" wheels;3in1 (PT)    Recommendations for Other Services       Precautions / Restrictions Precautions Precautions: Knee Precaution Comments: no ROM Lt knee Required Braces or Orthoses: Knee Immobilizer - Left Knee Immobilizer - Left: On when out of bed or walking Restrictions Weight Bearing Restrictions: Yes LLE Weight Bearing: Non weight bearing    Mobility  Bed Mobility Overal bed mobility: Modified Independent                Transfers Overall transfer level: Needs assistance Equipment used: Rolling walker (2 wheeled) Transfers: Sit to/from Stand Sit to Stand: Supervision         General transfer comment: cues for applying KI prior to mobility; cues for UE and LE positioning  Ambulation/Gait Ambulation/Gait assistance: Min guard;Supervision Gait Distance (Feet): 150 Feet Assistive device: Rolling walker (2  wheeled) Gait Pattern/deviations: Step-to pattern     General Gait Details: verbal cues for RW positioning and maintaining NWB, distance to tolerance   Stairs Stairs: Yes Stairs assistance: Min guard Stair Management: With walker;Step to pattern;Backwards Number of Stairs: 3 General stair comments: verbal cues for safe technique with RW and maintaining NWB; pt performed well; provided stair handout   Wheelchair Mobility    Modified Rankin (Stroke Patients Only)       Balance                                            Cognition Arousal/Alertness: Awake/alert Behavior During Therapy: WFL for tasks assessed/performed Overall Cognitive Status: Within Functional Limits for tasks assessed                                        Exercises      General Comments        Pertinent Vitals/Pain Pain Assessment: No/denies pain    Home Living                      Prior Function            PT Goals (current goals can now be found in the care plan section) Progress towards PT goals: Progressing toward goals    Frequency  Min 5X/week      PT Plan Current plan remains appropriate    Co-evaluation              AM-PAC PT "6 Clicks" Mobility   Outcome Measure  Help needed turning from your back to your side while in a flat bed without using bedrails?: A Little Help needed moving from lying on your back to sitting on the side of a flat bed without using bedrails?: A Little Help needed moving to and from a bed to a chair (including a wheelchair)?: A Little Help needed standing up from a chair using your arms (Neal.g., wheelchair or bedside chair)?: A Little Help needed to walk in hospital room?: A Little Help needed climbing 3-5 steps with a railing? : A Little 6 Click Score: 18    End of Session Equipment Utilized During Treatment: Gait belt Activity Tolerance: Patient tolerated treatment well Patient left: in  chair;with call bell/phone within reach Nurse Communication: Mobility status PT Visit Diagnosis: Other abnormalities of gait and mobility (R26.89)     Time: 7673-4193 PT Time Calculation (min) (ACUTE ONLY): 12 min  Charges:  $Gait Training: 8-22 mins                     Annette Neal, DPT Acute Rehabilitation Services Pager: [phone] Office: [phone]  Annette Neal 10/14/2019, 1:11 PM

## 2019-10-14 NOTE — Anesthesia Postprocedure Evaluation (Signed)
Anesthesia Post Note  Patient: Annette Neal  Procedure(s) Performed: ARTHROSCOPY KNEE debridement (Left Knee)     Patient location during evaluation: PACU Anesthesia Type: Regional and General Level of consciousness: awake and alert Pain management: pain level controlled Vital Signs Assessment: post-procedure vital signs reviewed and stable Respiratory status: spontaneous breathing, nonlabored ventilation, respiratory function stable and patient connected to nasal cannula oxygen Cardiovascular status: blood pressure returned to baseline and stable Postop Assessment: no apparent nausea or vomiting Anesthetic complications: no   No complications documented.  Last Vitals:  Vitals:   10/13/19 2220 10/14/19 0516  BP: 132/87 124/88  Pulse: 72 71  Resp: 16 18  Temp: 36.7 C 36.5 C  SpO2: 100% 100%    Last Pain:  Vitals:   10/14/19 0600  TempSrc:   PainSc: 5                  Talbert Trembath

## 2019-10-14 NOTE — Progress Notes (Signed)
Patient is going to buy walker from [facility] on the way home. Patient education performed with husband in attendance. Patient is in stable condition and excited to go home.

## 2019-10-14 NOTE — Progress Notes (Signed)
Subjective: 2 Days Post-Op Procedure(s) (LRB): ARTHROSCOPY KNEE debridement (Left) and irrigation Patient reports pain as 4 on 0-10 scale.   Patient reports no events overnight Was able to get up with therapy yesterday. Saw ID, they recommended PICC line and 4 weeks IV abx of cefazolin Waiting for PICC line placement.  Objective: Vital signs in last 24 hours: Temp:  [97.7 F (36.5 C)-98.8 F (37.1 C)] 97.7 F (36.5 C) (08/19 0516) Pulse Rate:  [68-97] 71 (08/19 0516) Resp:  [16-18] 18 (08/19 0516) BP: (104-132)/(69-88) 124/88 (08/19 0516) SpO2:  [98 %-100 %] 100 % (08/19 0516)  Intake/Output from previous day: 08/18 0701 - 08/19 0700 In: 2264.8 [P.O.:1200; I.V.:714.8; IV Piggyback:350] Out: 715 [Urine:700; Drains:15] Intake/Output this shift: No intake/output data recorded.  Recent Labs    10/12/19 1337  HGB 14.7   Recent Labs    10/12/19 1337  WBC 12.1*  RBC 4.68  HCT 45.6  PLT 286   Recent Labs    10/13/19 0259 10/14/19 0258  CREATININE 0.70 0.73   No results for input(s): LABPT, INR in the last 72 hours.  Hemovac drain: at time of removal <5 mL  Neurologically intact Neurovascular intact Sensation intact distally Intact pulses distally Dorsiflexion/Plantar flexion intact Incision: dressing C/D/I No cellulitis present Compartment soft   Assessment/Plan: 2 Days Post-Op Procedure(s) (LRB): ARTHROSCOPY KNEE debridement (Left) Up with therapy, NWB on left leg Knee Immobilizer needs to be on when out of bed. No ROM of left knee Continue ABX therapy per ID protocol Waiting for PICC line placement today.  If PICC line is placed today and patient doing well she can hopefully go home this afternoon.  We will continue to watch and hopeful discharge this afternoon   Ceasar Mons [phone] 10/14/2019, 7:18 AM

## 2019-10-16 LAB — BODY FLUID CULTURE: Culture: NORMAL

## 2019-10-18 LAB — AEROBIC/ANAEROBIC CULTURE W GRAM STAIN (SURGICAL/DEEP WOUND)

## 2019-10-22 NOTE — Discharge Summary (Signed)
Physician Discharge Summary  Patient ID: Annette Neal MRN: [id number] DOB/AGE: Nov 23, 1955 64 y.o.  Admit date: 10/12/2019 Discharge date: 10/14/2019  Admission Diagnoses: Left knee infection  Discharge Diagnoses:  Active Problems:   Infection of left knee [facility])   Discharged Condition: good  Hospital Course: patient was admitted on August 17 for a left knee infection and a left knee arthroscopic I&D.  Patient tolerated surgery well.  She was sent to the postop in PACU as well as the postop floor in stable condition.  She did well over her first night.  Infectious disease was consulted.  She was seen on postop day 1 by physical therapy and infectious disease.  Pain was continuing to improve.  She did well on postop day 1.  Postop day 2 a PICC line was placed and she was able to be discharged home on IV antibiotics that was recommended by infectious disease.  Consults: ID  Significant Diagnostic Studies: and microbiology: wound culture: positive for STAPHYLOCOCCUS LUGDUNENSIS   Treatments: patient had surgery on August 17, left knee arthroscopic I&D, pain medication: Oxycodone, Tylenol and Robaxin.  Antibiotics per infectious disease  Discharge Exam: Blood pressure 114/72, pulse 79, temperature 98.4 F (36.9 C), temperature source Oral, resp. rate 17, height _0  (1.676 m), weight 76.2 kg, last menstrual period 03/09/2011, SpO2 99 %. General appearance: alert, cooperative, appears stated age and no distress Extremities: extremities normal, atraumatic, no cyanosis or edema and Homans sign is negative, no sign of DVT Pulses: 2+ and symmetric Skin: Skin color, texture, turgor normal. No rashes or lesions Neurologic: Grossly normal Incision/Wound:  Disposition: Discharge disposition: 01-Home or Self Care       Discharge Instructions    [facility] pharmacist to adjust dose for Vancomycin, Aminoglycosides and other anti-infective therapies as requested by physician.    Complete by: As directed    [facility] to provide Cath Flo 737m   Complete by: As directed    Administer for PICC line occlusion and as ordered by physician for other access device issues.   Anaphylaxis Kit: Provided to treat any anaphylactic reaction to the medication being provided to the patient if First Dose or when requested by physician   Complete by: As directed    Epinephrine 117mml vial / amp: Administer 0.37m20m0.37ml70mubcutaneously once for moderate to severe anaphylaxis, nurse to call physician and pharmacy when reaction occurs and call 911 if needed for immediate care   Diphenhydramine 50mg65mIV vial: Administer 25-50mg 69mM PRN for first dose reaction, rash, itching, mild reaction, nurse to call physician and pharmacy when reaction occurs   Sodium Chloride 0.9% NS 500ml I337mdminister if needed for hypovolemic blood pressure drop or as ordered by physician after call to physician with anaphylactic reaction   Call MD / Call 911   Complete by: As directed    If you experience chest pain or shortness of breath, CALL 911 and be transported to the hospital emergency room.  If you develope a fever above 101 F, pus (white drainage) or increased drainage or redness at the wound, or calf pain, call your surgeon's office.   Change dressing on IV access line weekly and PRN   Complete by: As directed    Constipation Prevention   Complete by: As directed    Drink plenty of fluids.  Prune juice may be helpful.  You may use a stool softener, such as Colace (over the counter) 100 mg twice a day.  Use MiraLax (over  the counter) for constipation as needed.   Diet - low sodium heart healthy   Complete by: As directed    Discharge instructions   Complete by: As directed    Please keep dressings on dry and intact until follow up appointment Please start taking Aspirin 325 mg tabs twice a day for 6 weeks Keep knee immobilizer on when up Non weight bearing on left lower extremity Follow  up next week   Flush IV access with Sodium Chloride 0.9% and Heparin 10 units/ml or 100 units/ml   Complete by: As directed    Home infusion instructions - [facility]   Complete by: As directed    Instructions: Flush IV access with Sodium Chloride 0.9% and Heparin 10units/ml or 100units/ml   Change dressing on IV access line: Weekly and PRN   Instructions Cath Flo 29m: Administer for PICC Line occlusion and as ordered by physician for other access device   [facility] pharmacist to adjust dose for: Vancomycin, Aminoglycosides and other anti-infective therapies as requested by physician   Method of administration may be changed at the discretion of home infusion pharmacist based upon assessment of the patient and/or caregiver's ability to self-administer the medication ordered   Complete by: As directed    Non weight bearing   Complete by: As directed    Laterality: left   Extremity: Lower   Outpatient Parenteral Antibiotic Therapy Information Antibiotic: Cefazolin (Ancef) IVPB; Indications for use: PICC Line due to septic knee; End Date: 11/11/2019   Complete by: As directed    Antibiotic: Cefazolin (Ancef) IVPB   Indications for use: PICC Line due to septic knee   End Date: 11/11/2019     Allergies as of 10/14/2019      Reactions   Codeine Nausea And Vomiting   Demerol [meperidine] Nausea And Vomiting      Medication List    STOP taking these medications   HYDROcodone-acetaminophen 5-325 MG tablet Commonly known as: Norco   ibuprofen 200 MG tablet Commonly known as: ADVIL   predniSONE 10 MG tablet Commonly known as: DELTASONE     TAKE these medications   ceFAZolin  IVPB Commonly known as: ANCEF Inject 2 g into the vein every 8 (eight) hours for 26 days. Indication:  Septic Joint Infection First Dose: No Last Day of Therapy:  11/08/2019 Labs - Once weekly:  CBC/D and BMP, Labs - Every other week:  ESR and CRP Method of administration: IV  Push Method of administration may be changed at the discretion of home infusion pharmacist based upon assessment of the patient and/or caregiver's ability to self-administer the medication ordered.   Finacea 15 % Foam Generic drug: Azelaic Acid Apply 1 application topically daily.   ondansetron 4 MG tablet Commonly known as: Zofran Take 1 tablet (4 mg total) by mouth daily as needed for nausea or vomiting.   oxyCODONE 5 MG immediate release tablet Commonly known as: Roxicodone Take 1 tablet (5 mg total) by mouth every 8 (eight) hours as needed.            Discharge Care Instructions  (From admission, onward)         Start     Ordered   10/14/19 0000  Change dressing on IV access line weekly and PRN  (Home infusion instructions - [facility] )        10/14/19 1508   10/14/19 0000  Non weight bearing       Question Answer Comment  Laterality left   Extremity Lower      10/14/19 1508           Signed: Drue Novel, PA-C [facility] 10/22/2019, 7:58 AM

## 2019-11-08 ENCOUNTER — Telehealth: Payer: Self-pay

## 2019-11-08 ENCOUNTER — Ambulatory Visit: Payer: BC Managed Care – PPO | Admitting: Internal Medicine

## 2019-11-08 ENCOUNTER — Other Ambulatory Visit: Payer: Self-pay

## 2019-11-08 ENCOUNTER — Encounter: Payer: Self-pay | Admitting: Internal Medicine

## 2019-11-08 DIAGNOSIS — M00062 Staphylococcal arthritis, left knee: Secondary | ICD-10-CM | POA: Diagnosis not present

## 2019-11-08 DIAGNOSIS — Z452 Encounter for adjustment and management of vascular access device: Secondary | ICD-10-CM | POA: Diagnosis not present

## 2019-11-08 DIAGNOSIS — Z5181 Encounter for therapeutic drug level monitoring: Secondary | ICD-10-CM

## 2019-11-08 NOTE — Telephone Encounter (Signed)
Opened in Error.

## 2019-11-08 NOTE — Telephone Encounter (Signed)
Received call from [facility] RN expecting orders for patient to d/c picc line. Call transferred to [facility], who worked with Dr. Luciana Axe today during patient visit to give verbal orders per Dr. Luciana Axe. Valarie Cones

## 2019-11-08 NOTE — Progress Notes (Addendum)
   Subjective:    Patient ID: Annette Neal, female    DOB: May 30, 1955, 64 y.o.   MRN: [id number]  HPI Here for hsfu. She had undergone left knee arthroscopy in early July and developed knee pain and swelling with aspiration c/w infection.  Outpatient culture reportedly grew Staph saprophyticus per the note from Dr. Theda Sers but inpatient culture grew Staph lugdunensis.  She was placed on 4 weeks of IV cefazolin due to stop today, 9/13. 68,000 WBC in the synovial fluid.  Her last CRP was 59.2 last week which is down from 129.6.  ESR only 32 Her knee is doing much better than previous.  She is still on no weight bearing and has not started PT.  No significant pain. Pleased with the response. Tolerating the cefazolin with no rash or diarrhea.  No missed doses and has 2 doses left at home.  Due to finish 4 weeks today.   Review of Systems  Constitutional: Negative for chills and fever.  Gastrointestinal: Negative for diarrhea and nausea.  Skin: Negative for rash.       Objective:   Physical Exam Constitutional:      Appearance: Normal appearance.  Eyes:     General: No scleral icterus. Musculoskeletal:     Comments: Mild amount of warmth, no erythema, mild to moderate swelling  Skin:    Findings: No rash.  Neurological:     General: No focal deficit present.     Mental Status: She is alert.  Psychiatric:        Mood and Affect: Mood normal.    SH: former smoker       Assessment & Plan:

## 2019-11-08 NOTE — Assessment & Plan Note (Addendum)
She is doing well clinically.  Her CRP has remained a bit elevated though much improved from previous and looks well so will continue with plan of 4 weeks through today and will stop and observe off of antibiotics.  ESR also reassuring.  She has an appt with Dr. Theda Sers office later this week.

## 2019-11-08 NOTE — Assessment & Plan Note (Signed)
No issues with her picc line and will have it removed after her last dose today.

## 2019-11-08 NOTE — Telephone Encounter (Signed)
Gave orders to pull PICC per Dr. Ephriam Knuckles orders. Verbal order was read back and understood by Kerby Less, RN.   Also called [facility] and spoke with Eunice Blase, also gave verbal order per Dr. Luciana Axe to stop antibiotics. Crissie Figures

## 2019-11-08 NOTE — Assessment & Plan Note (Signed)
Creat, WBC and LFTs have remained good on labs.

## 2021-04-23 ENCOUNTER — Other Ambulatory Visit: Payer: Self-pay | Admitting: Obstetrics & Gynecology

## 2021-04-23 DIAGNOSIS — N6320 Unspecified lump in the left breast, unspecified quadrant: Secondary | ICD-10-CM

## 2021-04-26 ENCOUNTER — Other Ambulatory Visit: Payer: Self-pay | Admitting: Physician Assistant

## 2021-04-26 DIAGNOSIS — R1032 Left lower quadrant pain: Secondary | ICD-10-CM

## 2021-05-11 ENCOUNTER — Other Ambulatory Visit: Payer: Self-pay

## 2021-05-11 ENCOUNTER — Ambulatory Visit
Admission: RE | Admit: 2021-05-11 | Discharge: 2021-05-11 | Disposition: A | Discharge status: Home / Self Care | Payer: Medicare Other | Source: Ambulatory Visit | Attending: Physician Assistant | Admitting: Physician Assistant

## 2021-05-11 DIAGNOSIS — R1032 Left lower quadrant pain: Secondary | ICD-10-CM

## 2021-05-11 MED ORDER — IOPAMIDOL (ISOVUE-300) INJECTION 61%
100.0000 mL | Freq: Once | INTRAVENOUS | Status: AC | PRN
  Administered 2021-05-11: 100 mL via INTRAVENOUS

## 2021-05-14 ENCOUNTER — Ambulatory Visit
Admission: RE | Admit: 2021-05-14 | Discharge: 2021-05-14 | Disposition: A | Discharge status: Home / Self Care | Payer: BC Managed Care – PPO | Source: Ambulatory Visit | Attending: Obstetrics & Gynecology | Admitting: Obstetrics & Gynecology

## 2021-05-14 ENCOUNTER — Ambulatory Visit
Admission: RE | Admit: 2021-05-14 | Discharge: 2021-05-14 | Disposition: A | Discharge status: Home / Self Care | Payer: Medicare Other | Source: Ambulatory Visit | Attending: Obstetrics & Gynecology | Admitting: Obstetrics & Gynecology

## 2021-05-14 DIAGNOSIS — N6320 Unspecified lump in the left breast, unspecified quadrant: Secondary | ICD-10-CM

## 2022-08-10 IMAGING — MG DIGITAL DIAGNOSTIC BILAT W/ TOMO W/ CAD
6 of 10 series · 6 of 30 positions shown · non-contrast
Comparison: Previous exam(s).

CLINICAL DATA: Tender mass in the UPPER-OUTER QUADRANT of the LEFT
breast, at the site of prior cyst removal. On physical exam her
provider confirms fullness in this region.

EXAM:
DIGITAL DIAGNOSTIC BILATERAL MAMMOGRAM WITH TOMOSYNTHESIS AND CAD;
ULTRASOUND LEFT BREAST LIMITED
TECHNIQUE: Bilateral digital diagnostic mammography and breast tomosynthesis
was performed. The images were evaluated with computer-aided
detection.; Targeted ultrasound examination of the left breast was
performed.

[R MLO synth-2D]
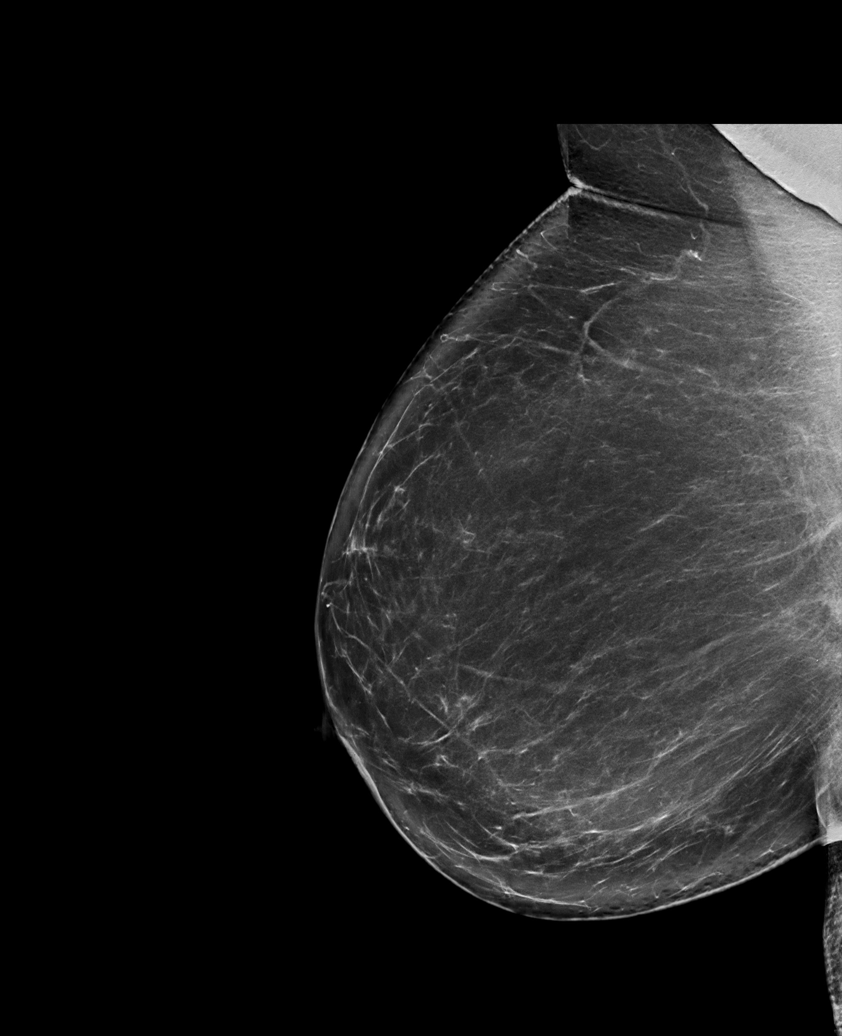

[L MLO synth-2D]
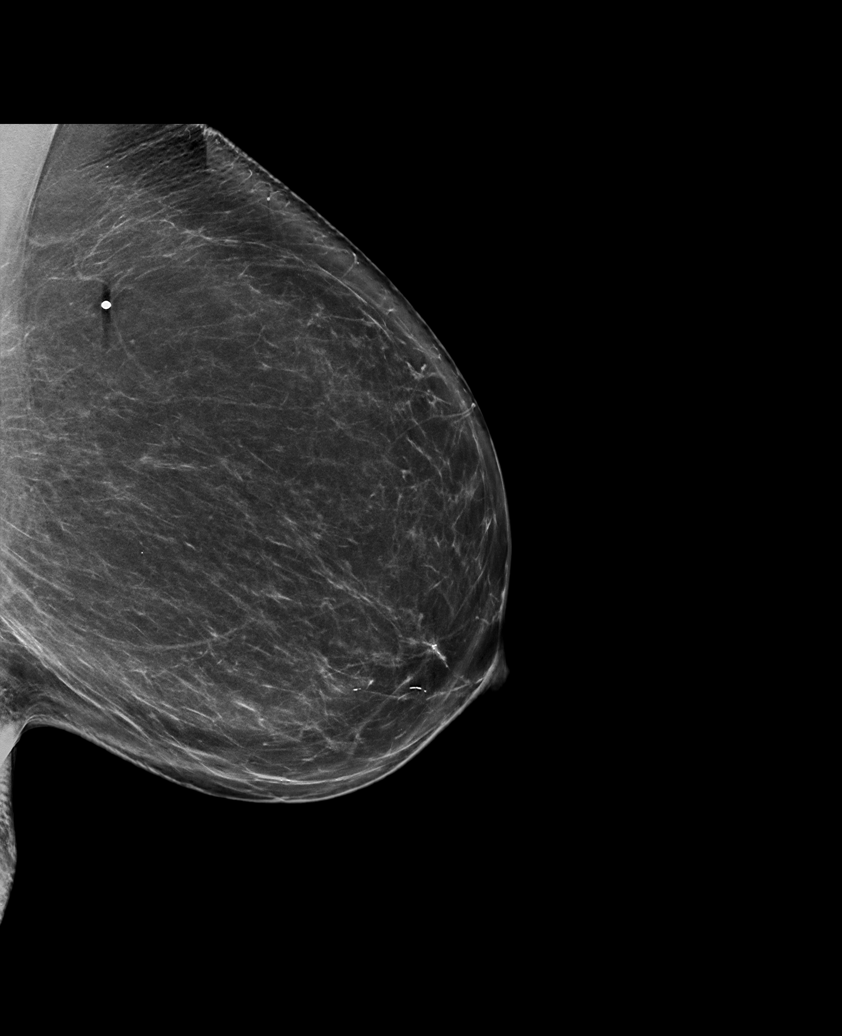

[L CC synth-2D]
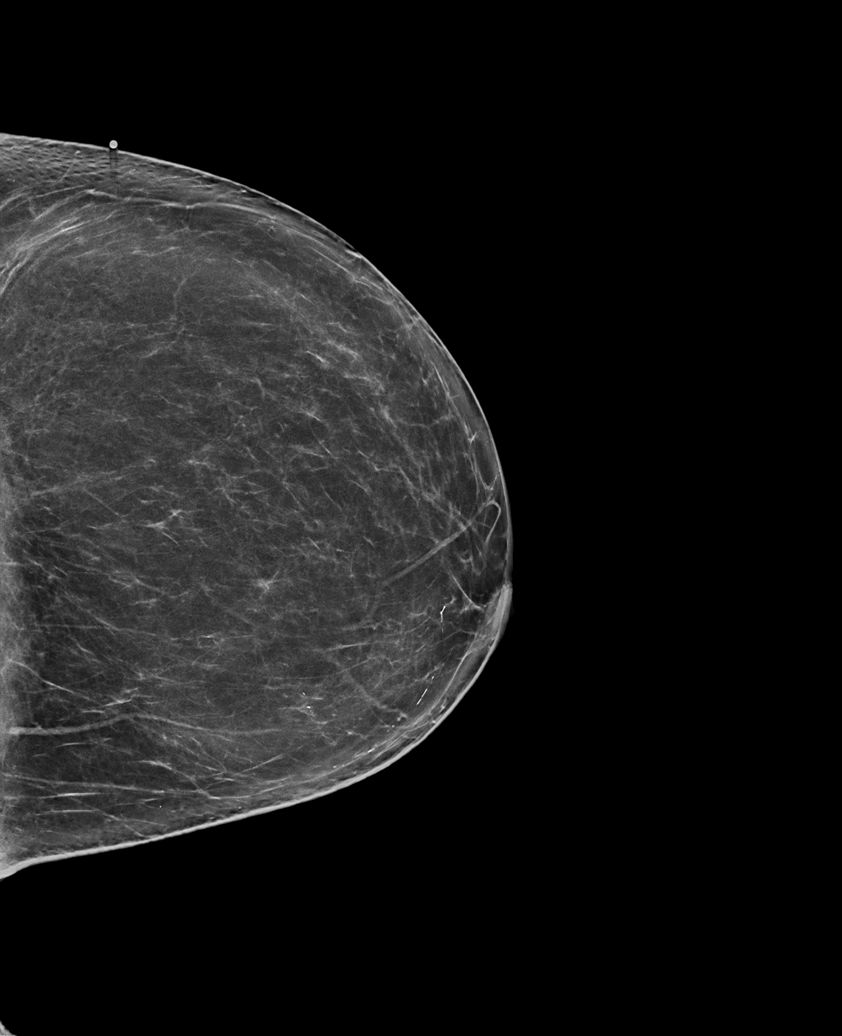

[L TAN synth-2D]
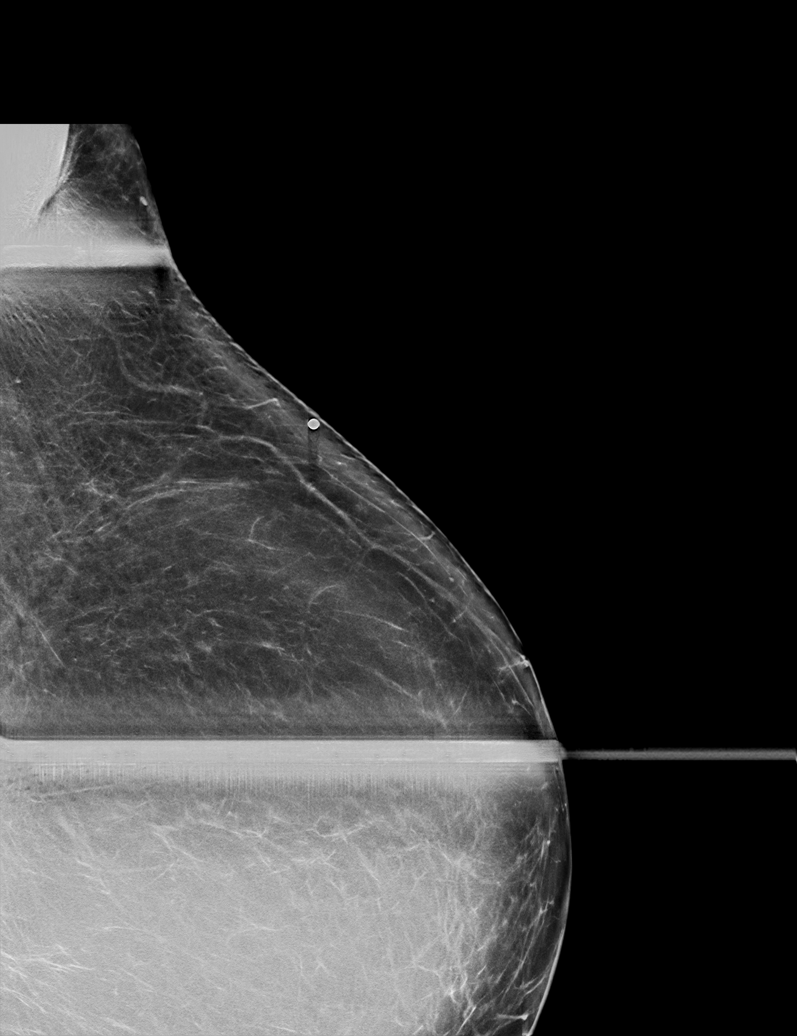

[R CC synth-2D]
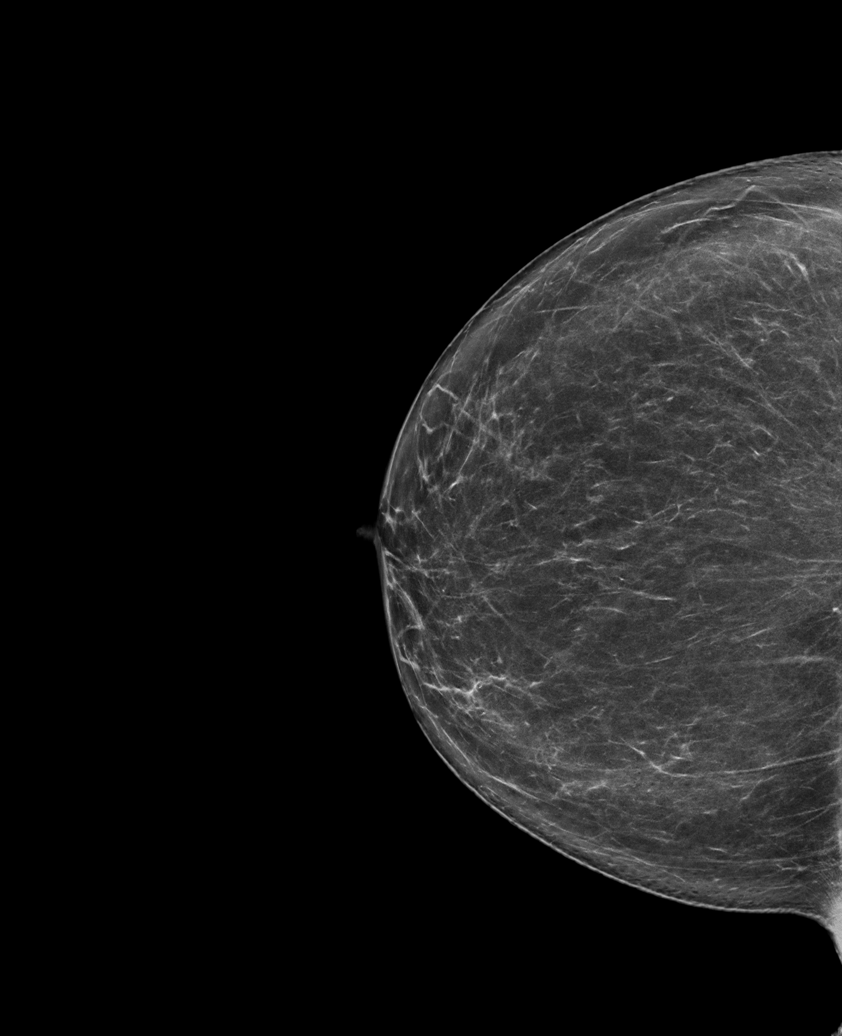

[R CC tomo · tomo slice 39/78.0]
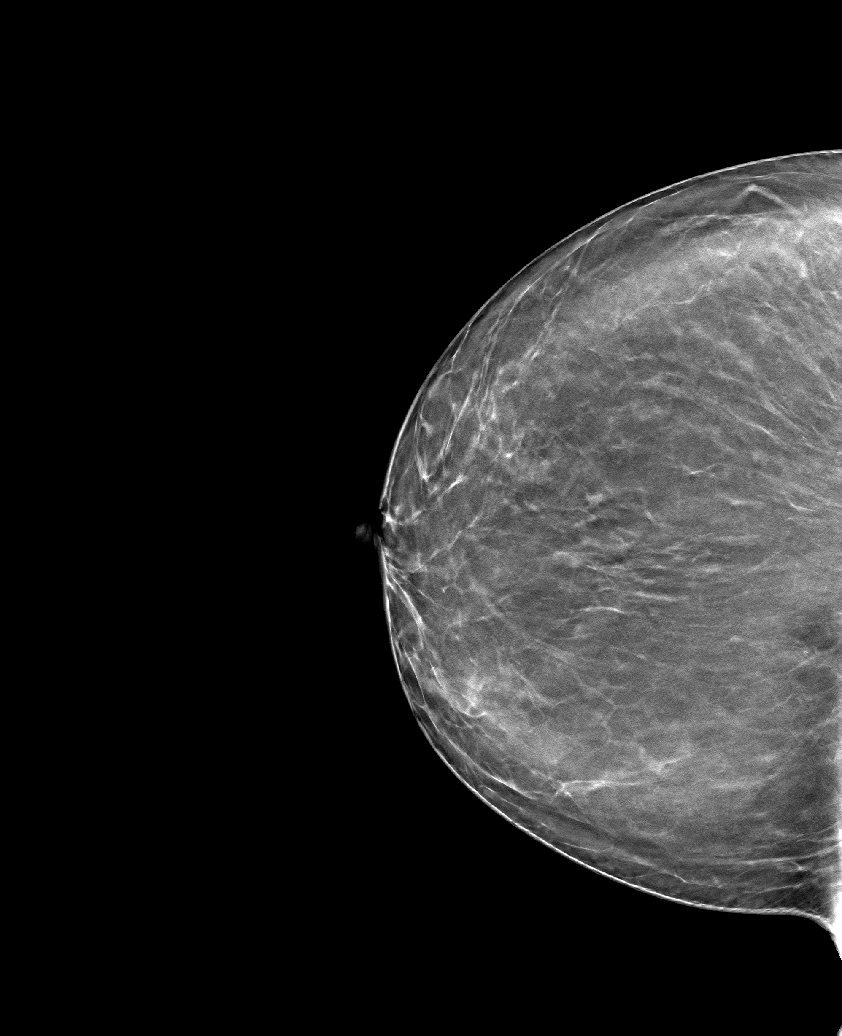

[6 of 30 positions shown; findings below may reference images not displayed]

ACR Breast Density Category b: There are scattered areas of
fibroglandular density.
FINDINGS: No suspicious mass, distortion, or microcalcifications are
identified to suggest presence of malignancy. Spot tangential view
in the area of concern shows normal appearing fibrofatty tissue
without mass.

On physical exam, I palpate minimal thickening in the UPPER-OUTER
QUADRANT of the LEFT breast corresponding to the area of concern. I
palpate no discrete mass. There is no associated erythema.

Targeted ultrasound is performed, showing normal appearing
fibrofatty tissue in the area concern. No suspicious mass,
distortion, or acoustic shadowing is demonstrated with ultrasound.
IMPRESSION: No mammographic or ultrasound evidence for malignancy.

RECOMMENDATION:
Screening mammogram in one year.(Code:9X-[id number])

I have discussed the findings and recommendations with the patient.
If applicable, a reminder letter will be sent to the patient
regarding the next appointment.

BI-RADS CATEGORY  1: Negative.

## 2022-10-15 ENCOUNTER — Other Ambulatory Visit: Payer: Self-pay | Admitting: Obstetrics & Gynecology

## 2022-10-15 DIAGNOSIS — N644 Mastodynia: Secondary | ICD-10-CM

## 2022-11-07 ENCOUNTER — Ambulatory Visit
Admission: RE | Admit: 2022-11-07 | Discharge: 2022-11-07 | Disposition: A | Discharge status: Home / Self Care | Payer: Medicare Other | Source: Ambulatory Visit | Attending: Obstetrics & Gynecology | Admitting: Obstetrics & Gynecology

## 2022-11-07 ENCOUNTER — Ambulatory Visit
Admission: RE | Admit: 2022-11-07 | Discharge: 2022-11-07 | Disposition: A | Discharge status: Home / Self Care | Payer: BC Managed Care – PPO | Source: Ambulatory Visit | Attending: Obstetrics & Gynecology | Admitting: Obstetrics & Gynecology

## 2022-11-07 DIAGNOSIS — N644 Mastodynia: Secondary | ICD-10-CM
